# Patient Record
Sex: Female | Born: 1995 | Hispanic: Refuse to answer | Marital: Single | Smoking: Never smoker
Health system: Southern US, Community
[De-identification: ages and names within clinical notes are randomized; demographics above are authoritative.]

## PROBLEM LIST (undated history)

## (undated) ENCOUNTER — Inpatient Hospital Stay: Payer: Self-pay

## (undated) DIAGNOSIS — F329 Major depressive disorder, single episode, unspecified: Secondary | ICD-10-CM

## (undated) DIAGNOSIS — E559 Vitamin D deficiency, unspecified: Secondary | ICD-10-CM

## (undated) DIAGNOSIS — F909 Attention-deficit hyperactivity disorder, unspecified type: Secondary | ICD-10-CM

## (undated) DIAGNOSIS — F32A Depression, unspecified: Secondary | ICD-10-CM

## (undated) DIAGNOSIS — J45909 Unspecified asthma, uncomplicated: Secondary | ICD-10-CM

## (undated) DIAGNOSIS — E785 Hyperlipidemia, unspecified: Secondary | ICD-10-CM

## (undated) HISTORY — DX: Vitamin D deficiency, unspecified: E55.9

## (undated) HISTORY — DX: Hyperlipidemia, unspecified: E78.5

---
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
## 2016-10-24 LAB — OB RESULTS CONSOLE HEPATITIS B SURFACE ANTIGEN: Hepatitis B Surface Ag: NEGATIVE

## 2016-10-24 LAB — OB RESULTS CONSOLE VARICELLA ZOSTER ANTIBODY, IGG: Varicella: IMMUNE

## 2016-10-24 LAB — OB RESULTS CONSOLE RUBELLA ANTIBODY, IGM: RUBELLA: IMMUNE

## 2016-10-24 LAB — OB RESULTS CONSOLE HIV ANTIBODY (ROUTINE TESTING): HIV: NONREACTIVE

## 2016-10-24 LAB — OB RESULTS CONSOLE RPR: RPR: NONREACTIVE

## 2017-03-11 ENCOUNTER — Observation Stay
Admission: EM | Admit: 2017-03-11 | Discharge: 2017-03-11 | Disposition: A | Discharge status: Home / Self Care | Payer: Medicaid Other | Attending: Obstetrics and Gynecology | Admitting: Obstetrics and Gynecology

## 2017-03-11 DIAGNOSIS — Z79899 Other long term (current) drug therapy: Secondary | ICD-10-CM | POA: Diagnosis not present

## 2017-03-11 DIAGNOSIS — Z7982 Long term (current) use of aspirin: Secondary | ICD-10-CM | POA: Insufficient documentation

## 2017-03-11 DIAGNOSIS — N939 Abnormal uterine and vaginal bleeding, unspecified: Secondary | ICD-10-CM | POA: Diagnosis present

## 2017-03-11 DIAGNOSIS — O4693 Antepartum hemorrhage, unspecified, third trimester: Principal | ICD-10-CM | POA: Insufficient documentation

## 2017-03-11 DIAGNOSIS — Z3A29 29 weeks gestation of pregnancy: Secondary | ICD-10-CM | POA: Diagnosis not present

## 2017-03-11 HISTORY — DX: Depression, unspecified: F32.A

## 2017-03-11 HISTORY — DX: Attention-deficit hyperactivity disorder, unspecified type: F90.9

## 2017-03-11 HISTORY — DX: Major depressive disorder, single episode, unspecified: F32.9

## 2017-03-11 LAB — URINALYSIS, COMPLETE (UACMP) WITH MICROSCOPIC
BILIRUBIN URINE: NEGATIVE
Glucose, UA: NEGATIVE mg/dL
Ketones, ur: NEGATIVE mg/dL
NITRITE: NEGATIVE
PROTEIN: NEGATIVE mg/dL
SPECIFIC GRAVITY, URINE: 1.009 (ref 1.005–1.030)
pH: 6 (ref 5.0–8.0)

## 2017-03-11 LAB — COMPREHENSIVE METABOLIC PANEL
ALT: 12 U/L — ABNORMAL LOW (ref 14–54)
AST: 21 U/L (ref 15–41)
Albumin: 2.8 g/dL — ABNORMAL LOW (ref 3.5–5.0)
Alkaline Phosphatase: 102 U/L (ref 38–126)
Anion gap: 7 (ref 5–15)
BUN: 5 mg/dL — ABNORMAL LOW (ref 6–20)
CHLORIDE: 108 mmol/L (ref 101–111)
CO2: 21 mmol/L — ABNORMAL LOW (ref 22–32)
Calcium: 8.7 mg/dL — ABNORMAL LOW (ref 8.9–10.3)
Creatinine, Ser: 0.41 mg/dL — ABNORMAL LOW (ref 0.44–1.00)
GFR calc non Af Amer: >60 mL/min (ref >60)
Glucose, Bld: 84 mg/dL (ref 65–99)
POTASSIUM: 3.8 mmol/L (ref 3.5–5.1)
Sodium: 136 mmol/L (ref 135–145)
Total Bilirubin: 0.2 mg/dL — ABNORMAL LOW (ref 0.3–1.2)
Total Protein: 6.7 g/dL (ref 6.5–8.1)

## 2017-03-11 LAB — CBC
HEMATOCRIT: 35.6 % (ref 35.0–47.0)
Hemoglobin: 11.8 g/dL — ABNORMAL LOW (ref 12.0–16.0)
MCH: 26.7 pg (ref 26.0–34.0)
MCHC: 33.1 g/dL (ref 32.0–36.0)
MCV: 80.6 fL (ref 80.0–100.0)
PLATELETS: 307 10*3/uL (ref 150–440)
RBC: 4.41 MIL/uL (ref 3.80–5.20)
RDW: 13.9 % (ref 11.5–14.5)
WBC: 14.8 10*3/uL — AB (ref 3.6–11.0)

## 2017-03-11 LAB — PROTEIN / CREATININE RATIO, URINE
CREATININE, URINE: 46 mg/dL
PROTEIN CREATININE RATIO: 0.22 mg/mg{creat} — AB (ref 0.00–0.15)
TOTAL PROTEIN, URINE: 10 mg/dL

## 2017-03-11 NOTE — OB Triage Note (Signed)
Pt also stated she has a 2cm cyst on left ovary, is not tender on palpation.

## 2017-03-11 NOTE — Discharge Summary (Signed)
Progress Notes Date of Service: 03/11/2017 8:40 PM Boykin Nearing, MD  Obstetrics  Expand All Collapse All   [] Hide copied text [] Hover for attribution information Patient ID: Selena Lowe, female   DOB: 13-Aug-1996, 21 y.o.   MRN: [id number]  Selena Lowe is a 21 y.o. female. She is at [redacted]w[redacted]d gestation. No LMP recorded. Patient is pregnant. Estimated Date of Delivery: 05/26/17 based on a 18 week u/s . Unsure LMP   Prenatal care site: [facility]  Chief Complaint: Pt states at 4pm today she note  Vaginal bleeding that came on unprovoked . No recent intercourse and no adb trauma. No CTX , no cramping .  Good fetal movt      Maternal Medical History:       Past Medical History:  Diagnosis Date  . Attention deficit hyperactivity disorder (ADHD)   . Depression     History reviewed. No pertinent surgical history.      Allergies  Allergen Reactions  . Penicillins Hives    Prior to Admission medications   Medication Sig Start Date End Date Taking? Authorizing Provider  aspirin 81 MG tablet Take 81 mg by mouth daily.   Yes Historical Provider, MD  prenatal vitamin w/FE, FA (PRENATAL 1 + 1) 27-1 MG TABS tablet Take 1 tablet by mouth daily at 12 noon.   Yes Historical Provider, MD     Social History: She  reports that she has never smoked. She has never used smokeless tobacco. She reports that she does not drink alcohol or use drugs.  Family History: family history includes Cancer in her maternal grandmother.  no history of gyn cancers  Review of Systems: A full review of systems was performed and negative except as noted in the HPI.   General Obesity  Eyes : no vision change  Gyn :+ vaginal bleeding  Cv : no HTN   O:  BP (!) 132/91   Pulse (!) 119   Temp 98.1 F (36.7 C) (Oral)  No results found for this or any previous visit (from the past 48 hour(s)).    U/S bedside by me : right posterior lateral placenta , no evidence of  marginal  Placenta or previa.VTX  Constitutional: NAD, AAOx3  HE/ENT: extraocular movements grossly intact, moist mucous membranes CV: RRR PULM: nl respiratory effort, CTABL                                         Abd: gravid, non-tender, non-distended, soft                                                  Ext: Non-tender, Nonedmeatous                     Psych: mood appropriate, speech normal Pelvic:cx: closed / 50%  / OOP No blood on glove   FHT/NST:+ 10x10accelsx2, baseline 130   TOCO: quiet  Lab Results Last 24 Hours       Results for orders placed or performed during the hospital encounter of 03/11/17 (from the past 24 hour(s))  CBC     Status: Abnormal   Collection Time: 03/11/17  8:49 PM  Result Value Ref Range   WBC 14.8 (H)  3.6 - 11.0 K/uL   RBC 4.41 3.80 - 5.20 MIL/uL   Hemoglobin 11.8 (L) 12.0 - 16.0 g/dL   HCT 35.6 35.0 - 47.0 %   MCV 80.6 80.0 - 100.0 fL   MCH 26.7 26.0 - 34.0 pg   MCHC 33.1 32.0 - 36.0 g/dL   RDW 13.9 11.5 - 14.5 %   Platelets 307 150 - 440 K/uL  Comprehensive metabolic panel     Status: Abnormal   Collection Time: 03/11/17  8:49 PM  Result Value Ref Range   Sodium 136 135 - 145 mmol/L   Potassium 3.8 3.5 - 5.1 mmol/L   Chloride 108 101 - 111 mmol/L   CO2 21 (L) 22 - 32 mmol/L   Glucose, Bld 84 65 - 99 mg/dL   BUN 5 (L) 6 - 20 mg/dL   Creatinine, Ser 0.41 (L) 0.44 - 1.00 mg/dL   Calcium 8.7 (L) 8.9 - 10.3 mg/dL   Total Protein 6.7 6.5 - 8.1 g/dL   Albumin 2.8 (L) 3.5 - 5.0 g/dL   AST 21 15 - 41 U/L   ALT 12 (L) 14 - 54 U/L   Alkaline Phosphatase 102 38 - 126 U/L   Total Bilirubin 0.2 (L) 0.3 - 1.2 mg/dL   GFR calc non Af Amer >60 >60 mL/min   GFR calc Af Amer >60 >60 mL/min   Anion gap 7 5 - 15  Protein / creatinine ratio, urine     Status: Abnormal   Collection Time: 03/11/17  9:02 PM  Result Value Ref Range   Creatinine, Urine 46 mg/dL   Total Protein, Urine 10 mg/dL   Protein Creatinine Ratio  0.22 (H) 0.00 - 0.15 mg/mg[Cre]  Urinalysis, Complete w Microscopic     Status: Abnormal   Collection Time: 03/11/17  9:02 PM  Result Value Ref Range   Color, Urine YELLOW (A) YELLOW   APPearance CLOUDY (A) CLEAR   Specific Gravity, Urine 1.009 1.005 - 1.030   pH 6.0 5.0 - 8.0   Glucose, UA NEGATIVE NEGATIVE mg/dL   Hgb urine dipstick MODERATE (A) NEGATIVE   Bilirubin Urine NEGATIVE NEGATIVE   Ketones, ur NEGATIVE NEGATIVE mg/dL   Protein, ur NEGATIVE NEGATIVE mg/dL   Nitrite NEGATIVE NEGATIVE   Leukocytes, UA LARGE (A) NEGATIVE   RBC / HPF 0-5 0 - 5 RBC/hpf   WBC, UA 6-30 0 - 5 WBC/hpf   Bacteria, UA MANY (A) NONE SEEN   Squamous Epithelial / LPF 0-5 (A) NONE SEEN   Mucous PRESENT      A/P:vaginal bleeding without etiology . Not ongoing . Bedside u/s nl . Reassuring fetal monitoring  Urine culture pending  D/c to home with bleeding precautions      ----- Gwen Her Zailyn Thoennes  MD Attending Obstetrician and Gynecologist [facility], Department of [facility]     Electronically signed by Boykin Nearing, MD at 03/11/2017 10:55 PM      ED to Hosp-Admission (Current) on 03/11/2017        Detailed Report

## 2017-03-11 NOTE — Progress Notes (Signed)
Patient ID: Selena Lowe, female   DOB: 1996-09-23, 21 y.o.   MRN: [id number]  Selena Lowe is a 21 y.o. female. She is at [redacted]w[redacted]d gestation. No LMP recorded. Patient is pregnant. Estimated Date of Delivery: 05/26/17 based on a 18 week u/s . Unsure LMP   Prenatal care site: [facility]  Chief Complaint: Pt states at 4pm today she note  Vaginal bleeding that came on unprovoked . No recent intercourse and no adb trauma. No CTX , no cramping .  Good fetal movt      Maternal Medical History:   Past Medical History:  Diagnosis Date  . Attention deficit hyperactivity disorder (ADHD)   . Depression     History reviewed. No pertinent surgical history.  Allergies  Allergen Reactions  . Penicillins Hives    Prior to Admission medications   Medication Sig Start Date End Date Taking? Authorizing Provider  aspirin 81 MG tablet Take 81 mg by mouth daily.   Yes Historical Provider, MD  prenatal vitamin w/FE, FA (PRENATAL 1 + 1) 27-1 MG TABS tablet Take 1 tablet by mouth daily at 12 noon.   Yes Historical Provider, MD     Social History: She  reports that she has never smoked. She has never used smokeless tobacco. She reports that she does not drink alcohol or use drugs.  Family History: family history includes Cancer in her maternal grandmother.  no history of gyn cancers  Review of Systems: A full review of systems was performed and negative except as noted in the HPI.   General Obesity  Eyes : no vision change  Gyn :+ vaginal bleeding  Cv : no HTN   O:  BP (!) 132/91   Pulse (!) 119   Temp 98.1 F (36.7 C) (Oral)  No results found for this or any previous visit (from the past 48 hour(s)).    U/S bedside by me : right posterior lateral placenta , no evidence of marginal  Placenta or previa.VTX  Constitutional: NAD, AAOx3  HE/ENT: extraocular movements grossly intact, moist mucous membranes CV: RRR PULM: nl respiratory effort, CTABL     Abd: gravid, non-tender,  non-distended, soft      Ext: Non-tender, Nonedmeatous   Psych: mood appropriate, speech normal Pelvic:cx: closed / 50%  / OOP No blood on glove   FHT/NST:+ 10x10accelsx2, baseline 130   TOCO: quiet  Results for orders placed or performed during the hospital encounter of 03/11/17 (from the past 24 hour(s))  CBC     Status: Abnormal   Collection Time: 03/11/17  8:49 PM  Result Value Ref Range   WBC 14.8 (H) 3.6 - 11.0 K/uL   RBC 4.41 3.80 - 5.20 MIL/uL   Hemoglobin 11.8 (L) 12.0 - 16.0 g/dL   HCT 35.6 35.0 - 47.0 %   MCV 80.6 80.0 - 100.0 fL   MCH 26.7 26.0 - 34.0 pg   MCHC 33.1 32.0 - 36.0 g/dL   RDW 13.9 11.5 - 14.5 %   Platelets 307 150 - 440 K/uL  Comprehensive metabolic panel     Status: Abnormal   Collection Time: 03/11/17  8:49 PM  Result Value Ref Range   Sodium 136 135 - 145 mmol/L   Potassium 3.8 3.5 - 5.1 mmol/L   Chloride 108 101 - 111 mmol/L   CO2 21 (L) 22 - 32 mmol/L   Glucose, Bld 84 65 - 99 mg/dL   BUN 5 (L) 6 - 20 mg/dL  Creatinine, Ser 0.41 (L) 0.44 - 1.00 mg/dL   Calcium 8.7 (L) 8.9 - 10.3 mg/dL   Total Protein 6.7 6.5 - 8.1 g/dL   Albumin 2.8 (L) 3.5 - 5.0 g/dL   AST 21 15 - 41 U/L   ALT 12 (L) 14 - 54 U/L   Alkaline Phosphatase 102 38 - 126 U/L   Total Bilirubin 0.2 (L) 0.3 - 1.2 mg/dL   GFR calc non Af Amer >60 >60 mL/min   GFR calc Af Amer >60 >60 mL/min   Anion gap 7 5 - 15  Protein / creatinine ratio, urine     Status: Abnormal   Collection Time: 03/11/17  9:02 PM  Result Value Ref Range   Creatinine, Urine 46 mg/dL   Total Protein, Urine 10 mg/dL   Protein Creatinine Ratio 0.22 (H) 0.00 - 0.15 mg/mg[Cre]  Urinalysis, Complete w Microscopic     Status: Abnormal   Collection Time: 03/11/17  9:02 PM  Result Value Ref Range   Color, Urine YELLOW (A) YELLOW   APPearance CLOUDY (A) CLEAR   Specific Gravity, Urine 1.009 1.005 - 1.030   pH 6.0 5.0 - 8.0   Glucose, UA NEGATIVE NEGATIVE mg/dL   Hgb urine dipstick MODERATE (A) NEGATIVE    Bilirubin Urine NEGATIVE NEGATIVE   Ketones, ur NEGATIVE NEGATIVE mg/dL   Protein, ur NEGATIVE NEGATIVE mg/dL   Nitrite NEGATIVE NEGATIVE   Leukocytes, UA LARGE (A) NEGATIVE   RBC / HPF 0-5 0 - 5 RBC/hpf   WBC, UA 6-30 0 - 5 WBC/hpf   Bacteria, UA MANY (A) NONE SEEN   Squamous Epithelial / LPF 0-5 (A) NONE SEEN   Mucous PRESENT    A/P:vaginal bleeding without etiology . Not ongoing . Bedside u/s nl . Reassuring fetal monitoring  Urine culture pending  D/c to home with bleeding precautions      ----- Gwen Her Sincere Liuzzi  MD Attending Obstetrician and Gynecologist [facility], Department of [facility]

## 2017-03-11 NOTE — OB Triage Note (Signed)
Pt arrived to LDR 2 from ED with vaginal bleeding happening around 1031-2811. It was a one time occurrence. Patient stated that it was enough to cover her hands. Saying it was a "hand full of quarter size clots". No frank bleeding from vagina, and has not had anymore bleeding since then. Pt denies recent sex, LOF, abdominal pain or cervical exam. Pt came to hospital right away. Pt placed on monitor and oriented to room. Will cont. To monitor.

## 2017-03-12 ENCOUNTER — Encounter (HOSPITAL_COMMUNITY): Payer: Self-pay

## 2017-03-14 LAB — URINE CULTURE
Culture: >=100000 — AB
SPECIAL REQUESTS: NORMAL

## 2017-05-29 ENCOUNTER — Other Ambulatory Visit: Payer: Self-pay | Admitting: Obstetrics and Gynecology

## 2017-05-29 DIAGNOSIS — Z3A40 40 weeks gestation of pregnancy: Secondary | ICD-10-CM

## 2017-06-01 ENCOUNTER — Inpatient Hospital Stay
Admission: EM | Admit: 2017-06-01 | Discharge: 2017-06-04 | DRG: 775 | Disposition: A | Discharge status: Home / Self Care | Payer: Medicaid Other | Attending: Obstetrics and Gynecology | Admitting: Obstetrics and Gynecology

## 2017-06-01 DIAGNOSIS — O9902 Anemia complicating childbirth: Secondary | ICD-10-CM | POA: Diagnosis present

## 2017-06-01 DIAGNOSIS — Z3A41 41 weeks gestation of pregnancy: Secondary | ICD-10-CM | POA: Diagnosis not present

## 2017-06-01 DIAGNOSIS — Z7982 Long term (current) use of aspirin: Secondary | ICD-10-CM | POA: Diagnosis not present

## 2017-06-01 DIAGNOSIS — Z68.41 Body mass index (BMI) pediatric, 85th percentile to less than 95th percentile for age: Secondary | ICD-10-CM

## 2017-06-01 DIAGNOSIS — O99214 Obesity complicating childbirth: Secondary | ICD-10-CM | POA: Diagnosis present

## 2017-06-01 DIAGNOSIS — D649 Anemia, unspecified: Secondary | ICD-10-CM | POA: Diagnosis present

## 2017-06-01 DIAGNOSIS — O480 Post-term pregnancy: Secondary | ICD-10-CM | POA: Diagnosis present

## 2017-06-01 DIAGNOSIS — Z880 Allergy status to penicillin: Secondary | ICD-10-CM

## 2017-06-01 HISTORY — DX: Unspecified asthma, uncomplicated: J45.909

## 2017-06-01 LAB — COMPREHENSIVE METABOLIC PANEL
ALK PHOS: 192 U/L — AB (ref 38–126)
ALT: 12 U/L — ABNORMAL LOW (ref 14–54)
ANION GAP: 9 (ref 5–15)
AST: 28 U/L (ref 15–41)
Albumin: 2.3 g/dL — ABNORMAL LOW (ref 3.5–5.0)
BILIRUBIN TOTAL: 0.2 mg/dL — AB (ref 0.3–1.2)
BUN: 8 mg/dL (ref 6–20)
CALCIUM: 10.4 mg/dL — AB (ref 8.9–10.3)
CO2: 23 mmol/L (ref 22–32)
Chloride: 105 mmol/L (ref 101–111)
Creatinine, Ser: 0.52 mg/dL (ref 0.44–1.00)
GFR calc non Af Amer: >60 mL/min (ref >60)
Glucose, Bld: 97 mg/dL (ref 65–99)
POTASSIUM: 4.3 mmol/L (ref 3.5–5.1)
Sodium: 137 mmol/L (ref 135–145)
TOTAL PROTEIN: 6.4 g/dL — AB (ref 6.5–8.1)

## 2017-06-01 LAB — CBC
HCT: 36.2 % (ref 35.0–47.0)
Hemoglobin: 11.9 g/dL — ABNORMAL LOW (ref 12.0–16.0)
MCH: 25.8 pg — AB (ref 26.0–34.0)
MCHC: 33 g/dL (ref 32.0–36.0)
MCV: 78.2 fL — ABNORMAL LOW (ref 80.0–100.0)
PLATELETS: 305 10*3/uL (ref 150–440)
RBC: 4.63 MIL/uL (ref 3.80–5.20)
RDW: 15.4 % — ABNORMAL HIGH (ref 11.5–14.5)
WBC: 15.2 10*3/uL — ABNORMAL HIGH (ref 3.6–11.0)

## 2017-06-01 LAB — PROTEIN / CREATININE RATIO, URINE
CREATININE, URINE: 23 mg/dL
PROTEIN CREATININE RATIO: 0.3 mg/mg{creat} — AB (ref 0.00–0.15)
TOTAL PROTEIN, URINE: 7 mg/dL

## 2017-06-01 LAB — URIC ACID: URIC ACID, SERUM: 5.8 mg/dL (ref 2.3–6.6)

## 2017-06-01 MED ORDER — SOD CITRATE-CITRIC ACID 500-334 MG/5ML PO SOLN
30.0000 mL | ORAL | Status: DC | PRN
  Administered 2017-06-01: 30 mL via ORAL
  Filled 2017-06-01 (×2): qty 30

## 2017-06-01 MED ORDER — ONDANSETRON HCL 4 MG/2ML IJ SOLN
4.0000 mg | Freq: Four times a day (QID) | INTRAMUSCULAR | Status: DC | PRN
  Administered 2017-06-02: 4 mg via INTRAVENOUS
  Filled 2017-06-01: qty 2

## 2017-06-01 MED ORDER — AMMONIA AROMATIC IN INHA
RESPIRATORY_TRACT | Status: AC
  Filled 2017-06-01: qty 10

## 2017-06-01 MED ORDER — LIDOCAINE HCL (PF) 1 % IJ SOLN
INTRAMUSCULAR | Status: AC
  Filled 2017-06-01: qty 30

## 2017-06-01 MED ORDER — LACTATED RINGERS IV SOLN: 500.0000 mL | INTRAVENOUS | Status: DC | PRN

## 2017-06-01 MED ORDER — LACTATED RINGERS IV SOLN
INTRAVENOUS | Status: DC
  Administered 2017-06-01 – 2017-06-02 (×2): via INTRAVENOUS
  Administered 2017-06-02: 1000 mL via INTRAVENOUS

## 2017-06-01 MED ORDER — HYDRALAZINE HCL 20 MG/ML IJ SOLN
10.0000 mg | Freq: Once | INTRAMUSCULAR | Status: DC | PRN
  Filled 2017-06-01: qty 0.5

## 2017-06-01 MED ORDER — LABETALOL HCL 5 MG/ML IV SOLN
20.0000 mg | INTRAVENOUS | Status: DC | PRN
  Filled 2017-06-01: qty 16

## 2017-06-01 MED ORDER — OXYTOCIN 10 UNIT/ML IJ SOLN
INTRAMUSCULAR | Status: AC
  Filled 2017-06-01: qty 2

## 2017-06-01 MED ORDER — MISOPROSTOL 200 MCG PO TABS
ORAL_TABLET | ORAL | Status: AC
  Filled 2017-06-01: qty 4

## 2017-06-01 MED ORDER — OXYTOCIN 40 UNITS IN LACTATED RINGERS INFUSION - SIMPLE MED
1.0000 m[IU]/min | INTRAVENOUS | Status: DC
  Administered 2017-06-01: 1 m[IU]/min via INTRAVENOUS
  Filled 2017-06-01: qty 1000

## 2017-06-01 MED ORDER — OXYTOCIN 40 UNITS IN LACTATED RINGERS INFUSION - SIMPLE MED: 2.5000 [IU]/h | INTRAVENOUS | Status: DC

## 2017-06-01 MED ORDER — OXYTOCIN BOLUS FROM INFUSION: 500.0000 mL | Freq: Once | INTRAVENOUS | Status: DC

## 2017-06-01 MED ORDER — LIDOCAINE HCL (PF) 1 % IJ SOLN: 30.0000 mL | INTRAMUSCULAR | Status: DC | PRN

## 2017-06-01 MED ORDER — TERBUTALINE SULFATE 1 MG/ML IJ SOLN: 0.2500 mg | Freq: Once | INTRAMUSCULAR | Status: DC | PRN

## 2017-06-01 MED ORDER — SODIUM CHLORIDE FLUSH 0.9 % IV SOLN
INTRAVENOUS | Status: AC
  Filled 2017-06-01: qty 10

## 2017-06-01 MED ORDER — ACETAMINOPHEN 325 MG PO TABS: 650.0000 mg | ORAL_TABLET | ORAL | Status: DC | PRN

## 2017-06-01 MED ORDER — BUTORPHANOL TARTRATE 1 MG/ML IJ SOLN
1.0000 mg | INTRAMUSCULAR | Status: DC | PRN
  Administered 2017-06-02 (×2): 1 mg via INTRAVENOUS
  Filled 2017-06-01: qty 2

## 2017-06-01 MED ORDER — MISOPROSTOL 25 MCG QUARTER TABLET: 25.0000 ug | ORAL_TABLET | ORAL | Status: DC | PRN

## 2017-06-02 ENCOUNTER — Encounter: Payer: Self-pay | Admitting: Anesthesiology

## 2017-06-02 ENCOUNTER — Inpatient Hospital Stay: Payer: Medicaid Other | Admitting: Anesthesiology

## 2017-06-02 LAB — TYPE AND SCREEN
ABO/RH(D): B POS
ANTIBODY SCREEN: NEGATIVE

## 2017-06-02 MED ORDER — SODIUM CHLORIDE 0.9 % IV SOLN
INTRAVENOUS | Status: DC | PRN
  Administered 2017-06-02 (×2): 5 mL via EPIDURAL

## 2017-06-02 MED ORDER — NALOXONE HCL 2 MG/2ML IJ SOSY
1.0000 ug/kg/h | PREFILLED_SYRINGE | INTRAMUSCULAR | Status: DC | PRN
  Filled 2017-06-02: qty 2

## 2017-06-02 MED ORDER — SODIUM CHLORIDE FLUSH 0.9 % IV SOLN
INTRAVENOUS | Status: AC
  Filled 2017-06-02: qty 10

## 2017-06-02 MED ORDER — WITCH HAZEL-GLYCERIN EX PADS: 1.0000 "application " | MEDICATED_PAD | CUTANEOUS | Status: DC | PRN

## 2017-06-02 MED ORDER — PRENATAL MULTIVITAMIN CH
1.0000 | ORAL_TABLET | Freq: Every day | ORAL | Status: DC
  Administered 2017-06-03 – 2017-06-04 (×2): 1 via ORAL
  Filled 2017-06-02 (×2): qty 1

## 2017-06-02 MED ORDER — IBUPROFEN 600 MG PO TABS
600.0000 mg | ORAL_TABLET | Freq: Four times a day (QID) | ORAL | Status: DC
  Administered 2017-06-02 – 2017-06-04 (×8): 600 mg via ORAL
  Filled 2017-06-02 (×8): qty 1

## 2017-06-02 MED ORDER — DIBUCAINE 1 % RE OINT: 1.0000 "application " | TOPICAL_OINTMENT | RECTAL | Status: DC | PRN

## 2017-06-02 MED ORDER — LIDOCAINE-EPINEPHRINE (PF) 1.5 %-1:200000 IJ SOLN
INTRAMUSCULAR | Status: DC | PRN
  Administered 2017-06-02: 3 mL via EPIDURAL

## 2017-06-02 MED ORDER — NALBUPHINE HCL 10 MG/ML IJ SOLN: 5.0000 mg | Freq: Once | INTRAMUSCULAR | Status: DC | PRN

## 2017-06-02 MED ORDER — SIMETHICONE 80 MG PO CHEW: 80.0000 mg | CHEWABLE_TABLET | ORAL | Status: DC | PRN

## 2017-06-02 MED ORDER — FENTANYL 2.5 MCG/ML W/ROPIVACAINE 0.2% IN NS 100 ML EPIDURAL INFUSION (ARMC-ANES)
EPIDURAL | Status: DC | PRN
  Administered 2017-06-02: 10 mL/h via EPIDURAL

## 2017-06-02 MED ORDER — SODIUM CHLORIDE 0.9% FLUSH: 3.0000 mL | INTRAVENOUS | Status: DC | PRN

## 2017-06-02 MED ORDER — FENTANYL 2.5 MCG/ML W/ROPIVACAINE 0.2% IN NS 100 ML EPIDURAL INFUSION (ARMC-ANES)
EPIDURAL | Status: AC
  Filled 2017-06-02: qty 100

## 2017-06-02 MED ORDER — MEASLES, MUMPS & RUBELLA VAC ~~LOC~~ INJ
0.5000 mL | INJECTION | Freq: Once | SUBCUTANEOUS | Status: DC
  Filled 2017-06-02: qty 0.5

## 2017-06-02 MED ORDER — ACETAMINOPHEN 325 MG PO TABS: 650.0000 mg | ORAL_TABLET | ORAL | Status: DC | PRN

## 2017-06-02 MED ORDER — ZOLPIDEM TARTRATE 5 MG PO TABS: 5.0000 mg | ORAL_TABLET | Freq: Every evening | ORAL | Status: DC | PRN

## 2017-06-02 MED ORDER — FENTANYL 2.5 MCG/ML W/ROPIVACAINE 0.2% IN NS 100 ML EPIDURAL INFUSION (ARMC-ANES): 10.0000 mL/h | EPIDURAL | Status: DC

## 2017-06-02 MED ORDER — ONDANSETRON HCL 4 MG/2ML IJ SOLN: 4.0000 mg | INTRAMUSCULAR | Status: DC | PRN

## 2017-06-02 MED ORDER — KETOROLAC TROMETHAMINE 30 MG/ML IJ SOLN: 30.0000 mg | Freq: Four times a day (QID) | INTRAMUSCULAR | Status: AC | PRN

## 2017-06-02 MED ORDER — BENZOCAINE-MENTHOL 20-0.5 % EX AERO
1.0000 "application " | INHALATION_SPRAY | CUTANEOUS | Status: DC | PRN
  Administered 2017-06-03: 1 via TOPICAL
  Filled 2017-06-02: qty 56

## 2017-06-02 MED ORDER — MEPERIDINE HCL 25 MG/ML IJ SOLN: 6.2500 mg | INTRAMUSCULAR | Status: DC | PRN

## 2017-06-02 MED ORDER — TETANUS-DIPHTH-ACELL PERTUSSIS 5-2.5-18.5 LF-MCG/0.5 IM SUSP: 0.5000 mL | Freq: Once | INTRAMUSCULAR | Status: DC

## 2017-06-02 MED ORDER — FLEET ENEMA 7-19 GM/118ML RE ENEM: 1.0000 | ENEMA | Freq: Every day | RECTAL | Status: DC | PRN

## 2017-06-02 MED ORDER — DIPHENHYDRAMINE HCL 25 MG PO CAPS: 25.0000 mg | ORAL_CAPSULE | Freq: Four times a day (QID) | ORAL | Status: DC | PRN

## 2017-06-02 MED ORDER — DIPHENHYDRAMINE HCL 25 MG PO CAPS: 25.0000 mg | ORAL_CAPSULE | ORAL | Status: DC | PRN

## 2017-06-02 MED ORDER — COCONUT OIL OIL
1.0000 | TOPICAL_OIL | Status: DC | PRN
Start: 2017-06-02 — End: 2017-06-04

## 2017-06-02 MED ORDER — NALOXONE HCL 0.4 MG/ML IJ SOLN: 0.4000 mg | INTRAMUSCULAR | Status: DC | PRN

## 2017-06-02 MED ORDER — SODIUM CHLORIDE 0.9 % IV SOLN: 250.0000 mL | INTRAVENOUS | Status: DC | PRN

## 2017-06-02 MED ORDER — SENNOSIDES-DOCUSATE SODIUM 8.6-50 MG PO TABS
2.0000 | ORAL_TABLET | ORAL | Status: DC
  Administered 2017-06-03 (×2): 2 via ORAL
  Filled 2017-06-02 (×2): qty 2

## 2017-06-02 MED ORDER — NALBUPHINE HCL 10 MG/ML IJ SOLN: 5.0000 mg | INTRAMUSCULAR | Status: DC | PRN

## 2017-06-02 MED ORDER — DIPHENHYDRAMINE HCL 50 MG/ML IJ SOLN
12.5000 mg | INTRAMUSCULAR | Status: DC | PRN
  Administered 2017-06-02: 12.5 mg via INTRAVENOUS
  Filled 2017-06-02: qty 1

## 2017-06-02 MED ORDER — ONDANSETRON HCL 4 MG/2ML IJ SOLN: 4.0000 mg | Freq: Three times a day (TID) | INTRAMUSCULAR | Status: DC | PRN

## 2017-06-02 MED ORDER — SODIUM CHLORIDE 0.9% FLUSH
3.0000 mL | Freq: Two times a day (BID) | INTRAVENOUS | Status: DC
  Administered 2017-06-03 (×2): 3 mL via INTRAVENOUS

## 2017-06-02 MED ORDER — ONDANSETRON HCL 4 MG PO TABS: 4.0000 mg | ORAL_TABLET | ORAL | Status: DC | PRN

## 2017-06-02 MED ORDER — LIDOCAINE HCL (PF) 1 % IJ SOLN
INTRAMUSCULAR | Status: DC | PRN
  Administered 2017-06-02: 3 mL via SUBCUTANEOUS

## 2017-06-02 MED ORDER — BISACODYL 10 MG RE SUPP: 10.0000 mg | Freq: Every day | RECTAL | Status: DC | PRN

## 2017-06-02 NOTE — Progress Notes (Signed)
Pushing well with UC's. Baby is down to a +2 station and pt is becoming tired. Working pull and tug method of pushing. Vtx is barely visible at uptimum pushing phase. Will continue pushing. FHT's 120-130.

## 2017-06-02 NOTE — Progress Notes (Signed)
S: Piushing with UC's, baby is now at +2 station and making progress,. Pt is exhausted but, doing well. On  epidural. + CTX, no LOF, VB O: Vitals:   06/02/17 0755 06/02/17 0915 06/02/17 1259 06/02/17 1300  BP:      Pulse:      Resp: 18 18 18 20   Temp:  97.9 F (36.6 C) 98.2 F (36.8 C)   TempSrc:  Oral Oral   SpO2:      Weight:      Height:         Gen: NAD, AAOx3      Abd: FNTTP      Ext: Non-tender, Nonedmeatous    mod var + accelerations no decelerations, Cat 1 strip  TOCO: Q 2  min SVE: C/C/+2 station with pushing   A/P:  21 y.o. yo G1P0 at [redacted]w[redacted]d for  postdates  Labor: progressing with pushing   FWB: Reassuring Cat 1 tracing.   GBS: neg  Antic NSVD with current sitaution   Catheryn Bacon 3:21 PM

## 2017-06-02 NOTE — Progress Notes (Signed)
VAGINAL DELIVERY NOTE:  Date of Delivery: 06/02/2017 Primary OB: [facility]  Gestational Age/EDD: [redacted]w[redacted]d 05/26/2017, by Ultrasound Antepartum complications: Obesity, Elevated BP without diagnosis of pre-ecclampsia,  Attending Physician: Glenis Smoker CNM Delivery Type:NSVD of viable female with 2nds degree lac Anesthesia: 1% local (xylocaine) Laceration: 2nd degree  Episiotomy: None Placenta: SDOP intact  Intrapartum complications: Maternal exhaustion with pushing Estimated Blood Loss: 200 ml's GBS: neg Procedure Details:  Pt pushing and in the lithotomy position and vtx del in straight OA pos, restituted to LOA with no CAN noted. Ant and post shoulder del and body followed with del at 1618 and to mom'a abd. Delayed cord clamping with cord cut by family after CCx2 and cut. Cord blood collection was obtained and sent to [facility] per protocol. SDOP intact at 1635. FF and lochia mod. No clots. 2nd degree lac noted in the perineal area and repaired after local infiltration of 1% xylocaine. 3-0 Ch on CT used for repair. Needle and sponge count correct. Bonding with infant with skin to skin. Needle and sponge count correct. Pt stable and hemostasis achieved.   Baby: Liveborn female, Apgars (7,8), weight 7 #, 4oz, baby named "Lelan Pons"

## 2017-06-02 NOTE — H&P (Signed)
Obstetrics Admission History & Physical  Referring Provider: [facility] Primary OBGYN: [facility]   Chief Complaint: admitted for IOL for postdates with LMP of 08/05/17 and EDD of 05/12/17. [place] at 10 1/7 weeks with EDD of 05/26/17.  History of Present Illness  21 y.o. G1P0 @ [redacted]w[redacted]d (Dating: . Pregnancy complicated by: Obesity,   Selena Lowe presents for IOL for postdates.  Review of Systems: Positive for pelvic pressure. .  Otherwise, her 12 point review of systems is negative or as noted in the History of Present Illness.  Patient Active Problem List   Diagnosis Date Noted  . Post term pregnancy 06/01/2017  . Vaginal spotting 03/11/2017     PMHx:  Past Medical History:  Diagnosis Date  . Asthma   . Attention deficit hyperactivity disorder (ADHD)   . Depression    PSHx: History reviewed. No pertinent surgical history. Medications:  Prescriptions Prior to Admission  Medication Sig Dispense Refill Last Dose  . aspirin 81 MG tablet Take 81 mg by mouth daily.   05/31/2017 at Unknown time  . prenatal vitamin w/FE, FA (PRENATAL 1 + 1) 27-1 MG TABS tablet Take 1 tablet by mouth daily at 12 noon.   05/31/2017 at Unknown time   Allergies: is allergic to penicillins. OBHx:  OB History  Gravida Para Term Preterm AB Living  1            SAB TAB Ectopic Multiple Live Births               # Outcome Date GA Lbr Len/2nd Weight Sex Delivery Anes PTL Lv  1 Current               GYNHx:  History of abnormal pap smears:  History of STIs:        FHx:  Family History  Problem Relation Age of Onset  . Cancer Maternal Grandmother    Soc Hx:  Social History   Social History  . Marital status: Single    Spouse name: N/A  . Number of children: N/A  . Years of education: N/A   Occupational History  . Not on file.   Social History Main Topics  . Smoking status: Never Smoker  . Smokeless tobacco: Never Used  . Alcohol use No  . Drug use: No  . Sexual activity: Yes   Other Topics  Concern  . Not on file   Social History Narrative  . No narrative on file   FOB present and involved.   Objective   Vitals:   06/02/17 1634 06/02/17 1704  BP:  (!) 117/93  Pulse:  100  Resp: 16   Temp: 98.7 F (37.1 C)    Temp:  [97.9 F (36.6 C)-98.8 F (37.1 C)] 98.7 F (37.1 C) (06/11 1634) Pulse Rate:  [71-118] 100 (06/11 1704) Resp:  [16-20] 16 (06/11 1634) BP: (93-155)/(52-106) 117/93 (06/11 1704) SpO2:  [97 %-98 %] 97 % (06/11 0520) Weight:  [247 lb (112 kg)] 247 lb (112 kg) (06/10 2035) Temp (24hrs), Avg:98.3 F (36.8 C), Min:97.9 F (36.6 C), Max:98.8 F (37.1 C)   Intake/Output Summary (Last 24 hours) at 06/02/17 1801 Last data filed at 06/02/17 1618  Gross per 24 hour  Intake                0 ml  Output              200 ml  Net             -  200 ml     Current Vital Signs 24h Vital Sign Ranges  T 98.7 F (37.1 C) Temp  Avg: 98.3 F (36.8 C)  Min: 97.9 F (36.6 C)  Max: 98.8 F (37.1 C)  BP (!) 117/93 BP  Min: 93/63  Max: 155/106  HR 100 Pulse  Avg: 93.4  Min: 71  Max: 118  RR 16 Resp  Avg: 18  Min: 16  Max: 20  SaO2 97 %   SpO2  Avg: 97.5 %  Min: 97 %  Max: 98 %       24 Hour I/O Current Shift I/O  Time Ins Outs No intake/output data recorded. 06/11 0701 - 06/11 1900 In: -  Out: 200    EFM: H&P done after delivery  Toco: see notes  General: Well nourished, well developed female in no acute distress.  Heart: S1S2, RRR, No M/R/G. Lungs: CTA bilat, no W/R/R. Skin:  Warm and dry.  Cardiovascular: S1S2, RRR, No M/R/G. Respiratory:  Clear to auscultation bilateral. Normal respiratory effort. No W/R/R. Abdomen: Gravid  Neuro/Psych:  Normal mood and affect.    SVE: See admit progress notes from this am Leopolds/EFW: 7#6 oz  Labs  Varicella non-immune, RI, B pos, Antibody neg, HIVneg, HepB neg, Gc/Ch neg, GBS neg,  Ultrasounds: 10 1/7 weeks with EDD moved to 05/26/17 Normal Anatomy scan.  Tdap UTD  Perinatal info    Assessment & Plan    21 y.o. G1P0 @ [redacted]w[redacted]d with signs and symptoms, with postdates and plans for IOL.  IUP at 41 weeks Obesity Risks of induction discussed with pt including fetal or uterine intolerance, bleeding, infection, poss of uterine rupture and pt accepts and agrees with IOL.  Catheryn Bacon, MSN, CNM, [facility]

## 2017-06-02 NOTE — Discharge Instructions (Signed)
Care After Vaginal Delivery °Congratulations on your new baby!! ° °Refer to this sheet in the next few weeks. These discharge instructions provide you with information on caring for yourself after delivery. Your caregiver may also give you specific instructions. Your treatment has been planned according to the most current medical practices available, but problems sometimes occur. Call your caregiver if you have any problems or questions after you go home. ° °HOME CARE INSTRUCTIONS °· Take over-the-counter or prescription medicines only as directed by your caregiver or pharmacist. °· Do not drink alcohol, especially if you are breastfeeding or taking medicine to relieve pain. °· Do not chew or smoke tobacco. °· Do not use illegal drugs. °· Continue to use good perineal care. Good perineal care includes: °¨ Wiping your perineum from front to back. °¨ Keeping your perineum clean. °· Do not use tampons or douche until your caregiver says it is okay. °· Shower, wash your hair, and take tub baths as directed by your caregiver. °· Wear a well-fitting bra that provides breast support. °· Eat healthy foods. °· Drink enough fluids to keep your urine clear or pale yellow. °· Eat high-fiber foods such as whole grain cereals and breads, brown rice, beans, and fresh fruits and vegetables every day. These foods may help prevent or relieve constipation. °· Follow your caregiver's recommendations regarding resumption of activities such as climbing stairs, driving, lifting, exercising, or traveling. Specifically, no driving for two weeks, so that you are comfortable reacting quickly in an emergency. °· Talk to your caregiver about resuming sexual activities. Resumption of sexual activities is dependent upon your risk of infection, your rate of healing, and your comfort and desire to resume sexual activity. Usually we recommend waiting about six weeks, or until your bleeding stops and you are interested in sex. °· Try to have someone  help you with your household activities and your newborn for at least a few days after you leave the hospital. Even longer is better. °· Rest as much as possible. Try to rest or take a nap when your newborn is sleeping. Sleep deprivation can be very hard after delivery. °· Increase your activities gradually. °· Keep all of your scheduled postpartum appointments. It is very important to keep your scheduled follow-up appointments. At these appointments, your caregiver will be checking to make sure that you are healing physically and emotionally. ° °SEEK MEDICAL CARE IF:  °· You are passing large clots from your vagina.  °· You have a foul smelling discharge from your vagina. °· You have trouble urinating. °· You are urinating frequently. °· You have pain when you urinate. °· You have a change in your bowel movements. °· You have increasing redness, pain, or swelling near your vaginal incision (episiotomy) or vaginal tear. °· You have pus draining from your episiotomy or vaginal tear. °· Your episiotomy or vaginal tear is separating. °· You have painful, hard, or reddened breasts. °· You have a severe headache. °· You have blurred vision or see spots. °· You feel sad or depressed. °· You have thoughts of hurting yourself or your newborn. °· You have questions about your care, the care of your newborn, or medicines. °· You are dizzy or light-headed. °· You have a rash. °· You have nausea or vomiting. °· You were breastfeeding and have not had a menstrual period within 12 weeks after you stopped breastfeeding. °· You are not breastfeeding and have not had a menstrual period by the 12th week after delivery. °· You   have a fever. ° °SEEK IMMEDIATE MEDICAL CARE IF:  °· You have persistent pain. °· You have chest pain. °· You have shortness of breath. °· You faint. °· You have leg pain. °· You have stomach pain. °· Your vaginal bleeding saturates two or more sanitary pads in 1 hour. ° °MAKE SURE YOU:  °· Understand these  instructions. °· Will get help right away if you are not doing well or get worse. °·  °Document Released: 12/06/2000 Document Revised: 04/25/2014 Document Reviewed: 08/05/2012 ° °ExitCare® Patient Information ©2015 [employer]. This information is not intended to replace advice given to you by your health care provider. Make sure you discuss any questions you have with your health care provider. ° °

## 2017-06-02 NOTE — Discharge Summary (Signed)
Obstetric Discharge Summary   Patient ID: Selena Lowe MRN: [id number] DOB/AGE: [birth year] 21 y.o.   Date of Admission: 06/01/2017  Date of Discharge: 06/04/17  Admitting Diagnosis: IUP  at [redacted]w[redacted]d  Secondary Diagnosis: Postdates IOL   Mode of Delivery: NSVD of viable female with 2nd degree lac with repair     Discharge Diagnosis: NSVD of viable female    Intrapartum Procedures: Pitocin, IUPC, IFSE,    Post partum procedures: None  Complications: None   Brief Hospital Course  Selena Lowe is a G53P0 who had a SVD on 06/02/17;  for further details of this delivery, please refer to the delivery note.  Patient had an uncomplicated postpartum course.  By time of discharge on PPD#2, her pain was controlled on oral pain medications; she had appropriate lochia and was ambulating, voiding without difficulty and tolerating regular diet.  She was deemed stable for discharge to home.   .    Labs: CBC Latest Ref Rng & Units 06/03/2017 06/01/2017 03/11/2017  WBC 3.6 - 11.0 K/uL 18.0(H) 15.2(H) 14.8(H)  Hemoglobin 12.0 - 16.0 g/dL 9.7(L) 11.9(L) 11.8(L)  Hematocrit 35.0 - 47.0 % 30.0(L) 36.2 35.6  Platelets 150 - 440 K/uL 273 305 307   B POS  Physical exam:  Blood pressure 121/80, pulse 88, temperature 98.1 F (36.7 C), temperature source Oral, resp. rate 18, height 5\' 2"  (1.575 m), weight 112 kg (247 lb), SpO2 98 %. General: alert and no distress Lochia: appropriate Abdomen: soft, NT Uterine Fundus: firm Perineum: healing and intact.  Extremities: No evidence of DVT seen on physical exam. No lower extremity edema.  Discharge Instructions: Per After Visit Summary. Activity: Advance as tolerated. Pelvic rest for 6 weeks.  Also refer to After Visit Summary Diet: Regular Medications:varivax , depo provera before d/c   Outpatient follow up:  Follow-up Information    Selena Lowe, CNM Follow up in 6 week(s).   Specialty:  Obstetrics and Gynecology Contact information: [address] [facility] [place] [address] [phone]         6 weeks at [facility] Postpartum contraception: LARC  Discharged Condition: Stable   Discharged to: HOME   Newborn Data:  Baby "Girl" named "Selena Lowe"  Disposition:Home   Apgars: APGAR (1 MIN): 7   APGAR (5 MINS): 8   APGAR (10 MINS):    Baby Feeding: Breast Selena Lowe, Selena Lowe 06/04/2017

## 2017-06-02 NOTE — Progress Notes (Addendum)
S: Resting comfortably with epidural. + CTX, no LOF, VB O: Vitals:   06/02/17 0515 06/02/17 0520 06/02/17 0656 06/02/17 0755  BP: 131/74  (!) 98/52   Pulse: 87  80   Resp:    18  Temp: 97.9 F (36.6 C)     TempSrc: Oral     SpO2: 98% 97%    Weight:      Height:         Gen: NAD, AAOx3      Abd: FNTTP      Ext: Non-tender, Nonedmeatous    FHT: mod var + accelerations, +early  decelerations TOCO: Q 2-3  Min. SVE: 3 cms per RN report    A/P:  21 y.o. yo G1P0 at [redacted]w[redacted]d for IOL for post-dates.   Labor: progressing   FWB: Reassuring Cat 1 tracing. EFW 7#14oz  GBS: neg  Contraception: LARC   Catheryn Bacon 8:20 AM

## 2017-06-02 NOTE — Anesthesia Procedure Notes (Signed)
Epidural Patient location during procedure: OB Start time: 06/02/2017 4:52 AM End time: 06/02/2017 5:03 AM  Staffing Anesthesiologist: Martha Clan Performed: anesthesiologist   Preanesthetic Checklist Completed: patient identified, site marked, surgical consent, pre-op evaluation, timeout performed, IV checked, risks and benefits discussed and monitors and equipment checked  Epidural Patient position: sitting Prep: ChloraPrep Patient monitoring: heart rate, continuous pulse ox and blood pressure Approach: midline Location: L3-L4 Injection technique: LOR saline  Needle:  Needle type: Tuohy  Needle gauge: 17 G Needle length: 9 cm and 9 Needle insertion depth: 7.5 cm Catheter type: closed end flexible Catheter size: 19 Gauge Catheter at skin depth: 12.5 cm Test dose: negative and 1.5% lidocaine with Epi 1:200 K  Assessment Sensory level: T10 Events: blood not aspirated, injection not painful, no injection resistance, negative IV test and no paresthesia  Additional Notes Pt. Evaluated and documentation done after procedure finished. Patient identified. Risks/Benefits/Options discussed with patient including but not limited to bleeding, infection, nerve damage, paralysis, failed block, incomplete pain control, headache, blood pressure changes, nausea, vomiting, reactions to medication both or allergic, itching and postpartum back pain. Confirmed with bedside nurse the patient's most recent platelet count. Confirmed with patient that they are not currently taking any anticoagulation, have any bleeding history or any family history of bleeding disorders. Patient expressed understanding and wished to proceed. All questions were answered. Sterile technique was used throughout the entire procedure. Please see nursing notes for vital signs. Test dose was given through epidural catheter and negative prior to continuing to dose epidural or start infusion. Warning signs of high block given to  the patient including shortness of breath, tingling/numbness in hands, complete motor block, or any concerning symptoms with instructions to call for help. Patient was given instructions on fall risk and not to get out of bed. All questions and concerns addressed with instructions to call with any issues or inadequate analgesia.   Patient tolerated the insertion well without immediate complications.Reason for block:procedure for pain

## 2017-06-02 NOTE — Progress Notes (Signed)
S: Resting comfortably without epidural. + CTX, no LOF, VB O: Vitals:   06/02/17 0520 06/02/17 0656 06/02/17 0755 06/02/17 0915  BP:  (!) 98/52    Pulse:  80    Resp:   18 18  Temp:    97.9 F (36.6 C)  TempSrc:    Oral  SpO2: 97%     Weight:      Height:        Gen: NAD, AAOx3      Abd: FNTTP      Ext: Non-tender, Nonedmeatous    FHT:+ mod var + accelerations + early  decelerations TOCO: Q 2  min SVE: 6/100/vtx-2 AROM performed for clear fluid with mod amt.   A/P:  21 y.o. yo G1P0 at [redacted]w[redacted]d for IOL.   Labor: progressing   FWB: Reassuring Cat 1 tracing.   GBS: neg     Catheryn Bacon 10:03 AM

## 2017-06-02 NOTE — Anesthesia Preprocedure Evaluation (Signed)
Anesthesia Evaluation  Patient identified by MRN, date of birth, ID band Patient awake    Reviewed: Allergy & Precautions, H&P , NPO status , Patient's Chart, lab work & pertinent test results, reviewed documented beta blocker date and time   History of Anesthesia Complications Negative for: history of anesthetic complications  Airway Mallampati: III  TM Distance: <3 FB Neck ROM: full    Dental  (+) Dental Advidsory Given   Pulmonary neg shortness of breath, asthma , neg recent URI,           Cardiovascular Exercise Tolerance: Good negative cardio ROS       Neuro/Psych PSYCHIATRIC DISORDERS (ADHD, PTSD, Depression) negative neurological ROS     GI/Hepatic Neg liver ROS, GERD  ,  Endo/Other  neg diabetesMorbid obesity  Renal/GU negative Renal ROS  negative genitourinary   Musculoskeletal   Abdominal   Peds  Hematology negative hematology ROS (+)   Anesthesia Other Findings Past Medical History: No date: Asthma No date: Attention deficit hyperactivity disorder (ADHD) No date: Depression   Reproductive/Obstetrics (+) Pregnancy                             Anesthesia Physical Anesthesia Plan  ASA: III  Anesthesia Plan: Epidural   Post-op Pain Management:    Induction:   PONV Risk Score and Plan:   Airway Management Planned:   Additional Equipment:   Intra-op Plan:   Post-operative Plan:   Informed Consent: I have reviewed the patients History and Physical, chart, labs and discussed the procedure including the risks, benefits and alternatives for the proposed anesthesia with the patient or authorized representative who has indicated his/her understanding and acceptance.   Dental Advisory Given  Plan Discussed with: Anesthesiologist, CRNA and Surgeon  Anesthesia Plan Comments:         Anesthesia Quick Evaluation

## 2017-06-02 NOTE — Progress Notes (Signed)
0425 MD notified for epidural  0432 Dr Rosey Bath In Room 0458Test Dose 99 Maternal HR 0505Bolus 1 0509Bolus 2  0510Drip started

## 2017-06-03 LAB — CBC
HEMATOCRIT: 30 % — AB (ref 35.0–47.0)
HEMOGLOBIN: 9.7 g/dL — AB (ref 12.0–16.0)
MCH: 25.3 pg — ABNORMAL LOW (ref 26.0–34.0)
MCHC: 32.4 g/dL (ref 32.0–36.0)
MCV: 78.2 fL — AB (ref 80.0–100.0)
Platelets: 273 10*3/uL (ref 150–440)
RBC: 3.83 MIL/uL (ref 3.80–5.20)
RDW: 15.2 % — AB (ref 11.5–14.5)
WBC: 18 10*3/uL — ABNORMAL HIGH (ref 3.6–11.0)

## 2017-06-03 LAB — RPR: RPR Ser Ql: NONREACTIVE

## 2017-06-03 NOTE — Care Management Note (Signed)
Case Management Note  Patient Details  Name: Selena Lowe MRN: [id number] Date of Birth: 27-May-1996  Subjective/Objective:      Wants more information about Medicaid for baby Lelan Pons.  Mother has maternity medicaid.              Action/Plan: discussed going to the Department of Social Services and starting the medicaid application progress for the newborn.    Expected Discharge Date:                  Expected Discharge Plan:     In-House Referral:   yes  Discharge planning Services   no  Post Acute Care Choice:    Choice offered to:     DME Arranged:   no DME Agency:     HH Arranged:   no HH Agency:     Status of Service:   Complete  If discussed at Long Length of Stay Meetings, dates discussed:    Additional Comments:  Shelbie Ammons, RN MSN CCM Care Management [phone] 06/03/2017, 9:35 AM

## 2017-06-03 NOTE — Progress Notes (Signed)
Post Partum Day 1 Subjective: no complaints, up ad lib, voiding and tolerating PO  Objective: Blood pressure 135/81, pulse (!) 111, temperature 98 F (36.7 C), temperature source Oral, resp. rate 18, height 5\' 2"  (1.575 m), weight 112 kg (247 lb), SpO2 98 %.  Physical Exam:  General: alert, cooperative and appears stated age Lochia: appropriate Uterine Fundus: firm DVT Evaluation: No evidence of DVT seen on physical exam. Negative Homan's sign. No cords or calf tenderness.   Recent Labs  06/01/17 2122 06/03/17 0510  HGB 11.9* 9.7*  HCT 36.2 30.0*    Assessment/Plan: Plan for discharge tomorrow  Bottle feeding Iron for anemia   LOS: 2 days   Benjaman Kindler 06/03/2017, 8:42 AM

## 2017-06-03 NOTE — Anesthesia Postprocedure Evaluation (Signed)
Anesthesia Post Note  Patient: Selena Lowe  Procedure(s) Performed: * No procedures listed *  Patient location during evaluation: Mother Baby Anesthesia Type: Epidural Level of consciousness: awake and alert Pain management: pain level controlled Vital Signs Assessment: post-procedure vital signs reviewed and stable Respiratory status: spontaneous breathing, nonlabored ventilation and respiratory function stable Cardiovascular status: stable Postop Assessment: no headache, no backache and epidural receding Anesthetic complications: no     Last Vitals:  Vitals:   06/03/17 0344 06/03/17 0717  BP: 115/68 135/81  Pulse: (!) 108 (!) 111  Resp: 18 18  Temp: 36.5 C 36.7 C    Last Pain:  Vitals:   06/03/17 0717  TempSrc: Oral  PainSc: (P) 1                  Estill Batten

## 2017-06-04 MED ORDER — VARICELLA VIRUS VACCINE LIVE 1350 PFU/0.5ML IJ SUSR
0.5000 mL | Freq: Once | INTRAMUSCULAR | Status: AC
  Administered 2017-06-04: 0.5 mL via SUBCUTANEOUS
  Filled 2017-06-04 (×3): qty 0.5

## 2017-06-04 MED ORDER — MEDROXYPROGESTERONE ACETATE 150 MG/ML IM SUSP: 150.0000 mg | Freq: Once | INTRAMUSCULAR | 3 refills | Status: DC

## 2017-06-04 MED ORDER — BENZOCAINE-MENTHOL 20-0.5 % EX AERO: 1.0000 "application " | INHALATION_SPRAY | CUTANEOUS | 1 refills | Status: DC | PRN

## 2017-06-04 MED ORDER — IBUPROFEN 600 MG PO TABS: 600.0000 mg | ORAL_TABLET | Freq: Four times a day (QID) | ORAL | 0 refills | Status: DC

## 2017-06-04 MED ORDER — MEDROXYPROGESTERONE ACETATE 150 MG/ML IM SUSP
150.0000 mg | Freq: Once | INTRAMUSCULAR | Status: AC
  Administered 2017-06-04: 150 mg via INTRAMUSCULAR
  Filled 2017-06-04: qty 1

## 2017-06-04 NOTE — Progress Notes (Signed)
Patient discharged home with infant and spouse. Discharge instructions, prescriptions and follow up appointment given to and reviewed with patient and spouse. Patient verbalized understanding. Escorted out via wheelchair by auxiliary.  

## 2018-01-02 DIAGNOSIS — F909 Attention-deficit hyperactivity disorder, unspecified type: Secondary | ICD-10-CM | POA: Diagnosis not present

## 2018-01-02 DIAGNOSIS — R5383 Other fatigue: Secondary | ICD-10-CM | POA: Diagnosis not present

## 2018-01-02 DIAGNOSIS — F339 Major depressive disorder, recurrent, unspecified: Secondary | ICD-10-CM | POA: Diagnosis not present

## 2018-01-02 DIAGNOSIS — F4310 Post-traumatic stress disorder, unspecified: Secondary | ICD-10-CM | POA: Diagnosis not present

## 2018-01-27 ENCOUNTER — Inpatient Hospital Stay: Payer: Medicaid Other | Attending: Oncology | Admitting: Oncology

## 2018-01-27 ENCOUNTER — Encounter: Payer: Self-pay | Admitting: Oncology

## 2018-01-27 ENCOUNTER — Other Ambulatory Visit: Payer: Self-pay

## 2018-01-27 ENCOUNTER — Inpatient Hospital Stay: Payer: Medicaid Other

## 2018-01-27 ENCOUNTER — Encounter (INDEPENDENT_AMBULATORY_CARE_PROVIDER_SITE_OTHER): Payer: Self-pay

## 2018-01-27 VITALS — BP 111/79 | HR 86 | Temp 99.5°F | Resp 16 | Wt 222.4 lb

## 2018-01-27 DIAGNOSIS — R718 Other abnormality of red blood cells: Secondary | ICD-10-CM | POA: Diagnosis not present

## 2018-01-27 DIAGNOSIS — F909 Attention-deficit hyperactivity disorder, unspecified type: Secondary | ICD-10-CM | POA: Insufficient documentation

## 2018-01-27 DIAGNOSIS — Z808 Family history of malignant neoplasm of other organs or systems: Secondary | ICD-10-CM | POA: Diagnosis not present

## 2018-01-27 DIAGNOSIS — Z801 Family history of malignant neoplasm of trachea, bronchus and lung: Secondary | ICD-10-CM | POA: Diagnosis not present

## 2018-01-27 DIAGNOSIS — D75839 Thrombocytosis, unspecified: Secondary | ICD-10-CM

## 2018-01-27 DIAGNOSIS — D473 Essential (hemorrhagic) thrombocythemia: Secondary | ICD-10-CM

## 2018-01-27 DIAGNOSIS — E611 Iron deficiency: Secondary | ICD-10-CM | POA: Insufficient documentation

## 2018-01-27 DIAGNOSIS — Z809 Family history of malignant neoplasm, unspecified: Secondary | ICD-10-CM | POA: Diagnosis not present

## 2018-01-27 NOTE — Progress Notes (Signed)
Hematology/Oncology Consult note [facility] Telephone:([phone] [phone]   Patient Care Team: Remi Haggard, FNP as PCP - General (Family Medicine)  REFERRING PROVIDER: Remi Haggard, FNP CHIEF COMPLAINTS/PURPOSE OF CONSULTATION:  Evaluation of thrombocytosis.  HISTORY OF PRESENTING ILLNESS:  Selena Lowe is a  22 y.o.  female with PMH listed below who was referred to me for evaluation of thrombocytosis.  Patient had lab work done on January 02, 2018, CBC showed WBC 10.2, hemoglobin 13.2, MCV 79, platelet counts 429, differential normal, B12 377, folate more than 23.4,  Patient reports feeling well at baseline.  Denies any fatigue, weight loss, night sweats, personal history of clots.  She has a 22-month old daughter.  She is on Depakote shots.  Denies any blood in the urine or stool.   Review of Systems  Constitutional: Negative for chills, fever, malaise/fatigue and weight loss.  HENT: Negative for ear pain, hearing loss and tinnitus.   Eyes: Negative for blurred vision and pain.  Respiratory: Negative for cough and hemoptysis.   Cardiovascular: Negative for chest pain, palpitations and claudication.  Gastrointestinal: Negative for abdominal pain, nausea and vomiting.  Genitourinary: Negative for dysuria and urgency.  Musculoskeletal: Negative for myalgias and neck pain.  Skin: Negative for rash.  Neurological: Negative for dizziness and tremors.  Endo/Heme/Allergies: Does not bruise/bleed easily.  Psychiatric/Behavioral: Negative for depression and substance abuse.    MEDICAL HISTORY:  Past Medical History:  Diagnosis Date  . Asthma   . Attention deficit hyperactivity disorder (ADHD)   . Depression     SURGICAL HISTORY: No past surgical history on file.  SOCIAL HISTORY: Social History   Socioeconomic History  . Marital status: Single    Spouse name: Not on file  . Number of children: Not on file  . Years of  education: Not on file  . Highest education level: Not on file  Social Needs  . Financial resource strain: Not on file  . Food insecurity - worry: Not on file  . Food insecurity - inability: Not on file  . Transportation needs - medical: Not on file  . Transportation needs - non-medical: Not on file  Occupational History  . Not on file  Tobacco Use  . Smoking status: Never Smoker  . Smokeless tobacco: Never Used  Substance and Sexual Activity  . Alcohol use: No  . Drug use: No  . Sexual activity: Yes  Other Topics Concern  . Not on file  Social History Narrative  . Not on file    FAMILY HISTORY: Family History  Problem Relation Age of Onset  . Cancer Maternal Grandmother     ALLERGIES:  is allergic to penicillins.  MEDICATIONS:  Current Outpatient Medications  Medication Sig Dispense Refill  . benzocaine-Menthol (DERMOPLAST) 20-0.5 % AERO Apply 1 application topically as needed for irritation (perineal discomfort). 1 each 1  . ibuprofen (ADVIL,MOTRIN) 600 MG tablet Take 1 tablet (600 mg total) by mouth every 6 (six) hours. 30 tablet 0  . medroxyPROGESTERone (DEPO-PROVERA) 150 MG/ML injection Inject 1 mL (150 mg total) into the muscle once. 1 mL 3  . prenatal vitamin w/FE, FA (PRENATAL 1 + 1) 27-1 MG TABS tablet Take 1 tablet by mouth daily at 12 noon.     No current facility-administered medications for this visit.      PHYSICAL EXAMINATION: ECOG PERFORMANCE STATUS: 0 - Asymptomatic Vitals:   01/27/18 1201  BP: 111/79  Pulse: 86  Resp: 16  Temp: 99.5  F (37.5 C)   Filed Weights   01/27/18 1201  Weight: 222 lb 6 oz (100.9 kg)    Physical Exam  Constitutional: She is oriented to person, place, and time and well-developed, well-nourished, and in no distress. No distress.  HENT:  Head: Normocephalic and atraumatic.  Mouth/Throat: No oropharyngeal exudate.  Eyes: EOM are normal. Pupils are equal, round, and reactive to light. No scleral icterus.  Neck:  Normal range of motion. Neck supple.  Cardiovascular: Normal rate and normal heart sounds.  No murmur heard. Pulmonary/Chest: Effort normal and breath sounds normal. She exhibits no tenderness.  Abdominal: Soft. Bowel sounds are normal. She exhibits no distension.  Musculoskeletal: Normal range of motion. She exhibits no edema.  Lymphadenopathy:    She has no cervical adenopathy.  Neurological: She is alert and oriented to person, place, and time.  Skin: Skin is warm and dry.  Psychiatric: Affect and judgment normal.     LABORATORY DATA:  I have reviewed the data as listed Lab Results  Component Value Date   WBC 18.0 (H) 06/03/2017   HGB 9.7 (L) 06/03/2017   HCT 30.0 (L) 06/03/2017   MCV 78.2 (L) 06/03/2017   PLT 273 06/03/2017   Recent Labs    03/11/17 2049 06/01/17 2122  NA 136 137  K 3.8 4.3  CL 108 105  CO2 21* 23  GLUCOSE 84 97  BUN 5* 8  CREATININE 0.41* 0.52  CALCIUM 8.7* 10.4*  GFRNONAA >60 >60  GFRAA >60 >60  PROT 6.7 6.4*  ALBUMIN 2.8* 2.3*  AST 21 28  ALT 12* 12*  ALKPHOS 102 192*  BILITOT 0.2* 0.2*       ASSESSMENT & PLAN:  1. Thrombocytosis ([place])   2. Microcytosis    Discussed with patient that mild thrombocytosis can be reactive to infection/iron deficiency, etc. versus secondary to underlying bone marrow disorder.  I will start with ordering basic lab work including CBC, smear review, CMP, iron, ferritin.  All questions were answered. The patient knows to call the clinic with any problems questions or concerns.  Return of visit: 2 weeks to discuss lab results. Thank you for this kind referral and the opportunity to participate in the care of this patient. A copy of today's note is routed to referring provider    Earlie Server, MD, PhD Hematology Oncology [facility] at [facility] Pager- [phone] 01/27/2018

## 2018-01-27 NOTE — Progress Notes (Signed)
Patient here today as a new patient  

## 2018-02-12 ENCOUNTER — Inpatient Hospital Stay: Payer: Medicaid Other | Admitting: Oncology

## 2018-02-17 NOTE — Progress Notes (Addendum)
Hematology/Oncology  Follow up note [facility] [phone] [phone]   Patient Care Team: Remi Haggard, FNP as PCP - General (Family Medicine)  REFERRING PROVIDER: Remi Haggard, FNP CHIEF COMPLAINTS/PURPOSE OF CONSULTATION:  Evaluation of thrombocytosis.  HISTORY OF PRESENTING ILLNESS:  Selena Lowe is a  22 y.o.  female with PMH listed below who was referred to me for evaluation of thrombocytosis.  Patient had lab work done on January 02, 2018, CBC showed WBC 10.2, hemoglobin 13.2, MCV 79, platelet counts 429, differential normal, B12 377, folate more than 23.4,  Patient reports feeling well at baseline.  Denies any fatigue, weight loss, night sweats, personal history of clots.  She has a 63-month old daughter.  She is on Depakote shots.  Denies any blood in the urine or stool.  INTERVAL HISTORY Selena Lowe is a 22 y.o. female who has above history reviewed by me today presents for follow up visit for management of thrombocytosis.  She was supposed to have lab work up done which she has not yet. She says she is busy as a new mother. No new complaints. Continue feeling well, some fatigue but attribute to busy parenting life.     Review of Systems  Constitutional: Positive for malaise/fatigue. Negative for chills, fever and weight loss.  HENT: Negative for ear pain, hearing loss, nosebleeds and tinnitus.   Eyes: Negative for blurred vision, photophobia and pain.  Respiratory: Negative for cough and hemoptysis.   Cardiovascular: Negative for chest pain, palpitations and claudication.  Gastrointestinal: Negative for abdominal pain, heartburn, nausea and vomiting.  Genitourinary: Negative for dysuria and urgency.  Musculoskeletal: Negative for myalgias and neck pain.  Skin: Negative for rash.  Neurological: Negative for dizziness, tingling and tremors.  Endo/Heme/Allergies: Negative for environmental allergies. Does not  bruise/bleed easily.  Psychiatric/Behavioral: Negative for depression and substance abuse. The patient is not nervous/anxious.     MEDICAL HISTORY:  Past Medical History:  Diagnosis Date  . Asthma   . Attention deficit hyperactivity disorder (ADHD)   . Depression   . Hyperlipidemia   . Vitamin D deficiency     SURGICAL HISTORY: No past surgical history on file.  SOCIAL HISTORY: Social History   Socioeconomic History  . Marital status: Single    Spouse name: Not on file  . Number of children: Not on file  . Years of education: Not on file  . Highest education level: Not on file  Social Needs  . Financial resource strain: Not on file  . Food insecurity - worry: Not on file  . Food insecurity - inability: Not on file  . Transportation needs - medical: Not on file  . Transportation needs - non-medical: Not on file  Occupational History  . Not on file  Tobacco Use  . Smoking status: Never Smoker  . Smokeless tobacco: Never Used  Substance and Sexual Activity  . Alcohol use: No  . Drug use: No  . Sexual activity: Yes  Other Topics Concern  . Not on file  Social History Narrative  . Not on file    FAMILY HISTORY: Family History  Problem Relation Age of Onset  . Cervical cancer Maternal Grandmother   . Lung cancer Maternal Grandmother   . Clotting disorder Maternal Grandmother   . Diabetes Maternal Grandmother     ALLERGIES:  is allergic to penicillins.  MEDICATIONS:  Current Outpatient Medications  Medication Sig Dispense Refill  . aspirin EC 81 MG tablet Take by  mouth.    . ergocalciferol (VITAMIN D2) 50000 units capsule Take 50,000 Units by mouth once a week.    Marland Kitchen ibuprofen (ADVIL,MOTRIN) 600 MG tablet Take 1 tablet (600 mg total) by mouth every 6 (six) hours. 30 tablet 0  . medroxyPROGESTERone (DEPO-PROVERA) 150 MG/ML injection Inject into the muscle.    . simvastatin (ZOCOR) 20 MG tablet Take 20 mg by mouth daily.     No current facility-administered  medications for this visit.      PHYSICAL EXAMINATION: ECOG PERFORMANCE STATUS: 0 - Asymptomatic Vitals:   02/18/18 1507  BP: 133/83  Pulse: 97  Temp: 99.4 F (37.4 C)   Filed Weights   02/18/18 1507  Weight: 229 lb 6 oz (104 kg)    Physical Exam  Constitutional: She is oriented to person, place, and time and well-developed, well-nourished, and in no distress. No distress.  HENT:  Head: Normocephalic and atraumatic.  Mouth/Throat: No oropharyngeal exudate.  Eyes: EOM are normal. Pupils are equal, round, and reactive to light. No scleral icterus.  Neck: Normal range of motion. Neck supple.  Cardiovascular: Normal rate and normal heart sounds.  No murmur heard. Pulmonary/Chest: Effort normal and breath sounds normal. She exhibits no tenderness.  Abdominal: Soft. Bowel sounds are normal. She exhibits no distension.  Musculoskeletal: Normal range of motion. She exhibits no edema.  Lymphadenopathy:    She has no cervical adenopathy.  Neurological: She is alert and oriented to person, place, and time.  Skin: Skin is warm and dry.  Psychiatric: Affect and judgment normal.     LABORATORY DATA:  I have reviewed the data as listed Lab Results  Component Value Date   WBC 10.6 02/18/2018   HGB 13.2 02/18/2018   HCT 39.9 02/18/2018   MCV 78.0 (L) 02/18/2018   PLT 403 02/18/2018   Recent Labs    03/11/17 2049 06/01/17 2122 02/18/18 1525  NA 136 137 138  K 3.8 4.3 4.0  CL 108 105 105  CO2 21* 23 23  GLUCOSE 84 97 121*  BUN 5* 8 14  CREATININE 0.41* 0.52 0.50  CALCIUM 8.7* 10.4* 9.7  GFRNONAA >60 >60 >60  GFRAA >60 >60 >60  PROT 6.7 6.4* 7.7  ALBUMIN 2.8* 2.3* 3.7  AST 21 28 20   ALT 12* 12* 16  ALKPHOS 102 192* 137*  BILITOT 0.2* 0.2* 0.2*       ASSESSMENT & PLAN:  1. Thrombocytosis ([place])   2. Microcytosis   3. Iron deficiency anemia, unspecified iron deficiency anemia type    Labs indicates iron deficiency Thrombocytosis is likely due to iron  deficiency. IV iron was discussed and patient is not interested.  Will start oral iron supplementation Rx.  All questions were answered. The patient knows to call the clinic with any problems questions or concerns.  Return of visit: 6 weeks.    Earlie Server, MD, PhD Hematology Oncology [facility] Pager- [phone] 02/18/2018

## 2018-02-18 ENCOUNTER — Other Ambulatory Visit: Payer: Self-pay

## 2018-02-18 ENCOUNTER — Inpatient Hospital Stay (HOSPITAL_BASED_OUTPATIENT_CLINIC_OR_DEPARTMENT_OTHER): Payer: Medicaid Other | Admitting: Oncology

## 2018-02-18 ENCOUNTER — Encounter: Payer: Self-pay | Admitting: Oncology

## 2018-02-18 ENCOUNTER — Inpatient Hospital Stay: Payer: Medicaid Other

## 2018-02-18 VITALS — BP 133/83 | HR 97 | Temp 99.4°F | Wt 229.4 lb

## 2018-02-18 DIAGNOSIS — Z801 Family history of malignant neoplasm of trachea, bronchus and lung: Secondary | ICD-10-CM

## 2018-02-18 DIAGNOSIS — D75839 Thrombocytosis, unspecified: Secondary | ICD-10-CM

## 2018-02-18 DIAGNOSIS — E611 Iron deficiency: Secondary | ICD-10-CM | POA: Diagnosis not present

## 2018-02-18 DIAGNOSIS — D473 Essential (hemorrhagic) thrombocythemia: Secondary | ICD-10-CM | POA: Diagnosis not present

## 2018-02-18 DIAGNOSIS — Z808 Family history of malignant neoplasm of other organs or systems: Secondary | ICD-10-CM

## 2018-02-18 DIAGNOSIS — R718 Other abnormality of red blood cells: Secondary | ICD-10-CM

## 2018-02-18 DIAGNOSIS — D509 Iron deficiency anemia, unspecified: Secondary | ICD-10-CM

## 2018-02-18 LAB — COMPREHENSIVE METABOLIC PANEL
ALT: 16 U/L (ref 14–54)
AST: 20 U/L (ref 15–41)
Albumin: 3.7 g/dL (ref 3.5–5.0)
Alkaline Phosphatase: 137 U/L — ABNORMAL HIGH (ref 38–126)
Anion gap: 10 (ref 5–15)
BILIRUBIN TOTAL: 0.2 mg/dL — AB (ref 0.3–1.2)
BUN: 14 mg/dL (ref 6–20)
CO2: 23 mmol/L (ref 22–32)
Calcium: 9.7 mg/dL (ref 8.9–10.3)
Chloride: 105 mmol/L (ref 101–111)
Creatinine, Ser: 0.5 mg/dL (ref 0.44–1.00)
GFR calc Af Amer: >60 mL/min (ref >60)
Glucose, Bld: 121 mg/dL — ABNORMAL HIGH (ref 65–99)
Potassium: 4 mmol/L (ref 3.5–5.1)
Sodium: 138 mmol/L (ref 135–145)
TOTAL PROTEIN: 7.7 g/dL (ref 6.5–8.1)

## 2018-02-18 LAB — CBC WITH DIFFERENTIAL/PLATELET
Basophils Absolute: 0.2 10*3/uL — ABNORMAL HIGH (ref 0–0.1)
Basophils Relative: 2 %
EOS ABS: 0.2 10*3/uL (ref 0–0.7)
Eosinophils Relative: 2 %
HCT: 39.9 % (ref 35.0–47.0)
Hemoglobin: 13.2 g/dL (ref 12.0–16.0)
LYMPHS ABS: 2.6 10*3/uL (ref 1.0–3.6)
Lymphocytes Relative: 24 %
MCH: 25.9 pg — AB (ref 26.0–34.0)
MCHC: 33.2 g/dL (ref 32.0–36.0)
MCV: 78 fL — ABNORMAL LOW (ref 80.0–100.0)
Monocytes Absolute: 0.6 10*3/uL (ref 0.2–0.9)
Monocytes Relative: 5 %
Neutro Abs: 7.1 10*3/uL — ABNORMAL HIGH (ref 1.4–6.5)
Neutrophils Relative %: 67 %
PLATELETS: 403 10*3/uL (ref 150–440)
RBC: 5.12 MIL/uL (ref 3.80–5.20)
RDW: 14.1 % (ref 11.5–14.5)
WBC: 10.6 10*3/uL (ref 3.6–11.0)

## 2018-02-18 LAB — IRON AND TIBC
IRON: 22 ug/dL — AB (ref 28–170)
Saturation Ratios: 5 % — ABNORMAL LOW (ref 10.4–31.8)
TIBC: 460 ug/dL — ABNORMAL HIGH (ref 250–450)
UIBC: 438 ug/dL

## 2018-02-18 LAB — TECHNOLOGIST SMEAR REVIEW

## 2018-02-18 LAB — FERRITIN: Ferritin: 26 ng/mL (ref 11–307)

## 2018-02-18 MED ORDER — DOCUSATE SODIUM 100 MG PO CAPS: 100.0000 mg | ORAL_CAPSULE | Freq: Every day | ORAL | 2 refills | Status: AC | PRN

## 2018-02-18 MED ORDER — FERROUS SULFATE 325 (65 FE) MG PO TBEC: 325.0000 mg | DELAYED_RELEASE_TABLET | Freq: Two times a day (BID) | ORAL | 3 refills | Status: AC

## 2018-02-18 NOTE — Progress Notes (Signed)
Patient here today for follow up. Patient states no new concerns today .  Patient states that she has discontinued the depo injection for birth control, she is now taking oral birth control pill, patient unsure of what the name is.

## 2018-02-18 NOTE — Addendum Note (Signed)
Addended by: Earlie Server on: 02/18/2018 08:27 PM   Modules accepted: Orders

## 2018-03-19 ENCOUNTER — Ambulatory Visit: Payer: Medicaid Other | Admitting: Oncology

## 2018-04-14 ENCOUNTER — Inpatient Hospital Stay: Payer: Self-pay | Attending: Oncology

## 2018-04-16 ENCOUNTER — Inpatient Hospital Stay: Payer: Self-pay | Admitting: Oncology

## 2018-05-09 ENCOUNTER — Emergency Department
Admission: EM | Admit: 2018-05-09 | Discharge: 2018-05-09 | Disposition: A | Discharge status: Home / Self Care | Payer: Medicaid Other | Attending: Emergency Medicine | Admitting: Emergency Medicine

## 2018-05-09 ENCOUNTER — Other Ambulatory Visit: Payer: Self-pay

## 2018-05-09 DIAGNOSIS — Z79899 Other long term (current) drug therapy: Secondary | ICD-10-CM | POA: Insufficient documentation

## 2018-05-09 DIAGNOSIS — H9201 Otalgia, right ear: Secondary | ICD-10-CM | POA: Diagnosis present

## 2018-05-09 DIAGNOSIS — H66001 Acute suppurative otitis media without spontaneous rupture of ear drum, right ear: Secondary | ICD-10-CM | POA: Diagnosis not present

## 2018-05-09 DIAGNOSIS — J45909 Unspecified asthma, uncomplicated: Secondary | ICD-10-CM | POA: Diagnosis not present

## 2018-05-09 LAB — GROUP A STREP BY PCR: GROUP A STREP BY PCR: NOT DETECTED

## 2018-05-09 MED ORDER — AZITHROMYCIN 250 MG PO TABS: ORAL_TABLET | ORAL | 0 refills | Status: DC

## 2018-05-09 NOTE — ED Provider Notes (Signed)
[facility] Provider Note  ____________________________________________  Time seen: Approximately 7:53 AM  I have reviewed the triage vital signs and the nursing notes.   HISTORY  Chief Complaint Otalgia and Sore Throat    HPI Selena Lowe is a 22 y.o. female who complains of right ear pain and sore throat for the past 2 days. Ear pain is gradually worsening, constant, nonradiating, no aggravating or alleviating factors. No recent trauma. No fevers chills or body aches. No swimming.  No throat swelling she does have a nonproductive cough. Denies shortness of breath. She does endorse severe seasonal allergies.      Past Medical History:  Diagnosis Date  . Asthma   . Attention deficit hyperactivity disorder (ADHD)   . Depression   . Hyperlipidemia   . Vitamin D deficiency      Patient Active Problem List   Diagnosis Date Noted  . ADHD (attention deficit hyperactivity disorder) 01/27/2018  . Indication for care in labor and delivery, delivered 06/02/2017  . Post term pregnancy 06/01/2017  . Vaginal spotting 03/11/2017     History reviewed. No pertinent surgical history.   Prior to Admission medications   Medication Sig Start Date End Date Taking? Authorizing Provider  aspirin EC 81 MG tablet Take by mouth.    [provider]  azithromycin (ZITHROMAX Z-PAK) 250 MG tablet Take 2 tablets (500 mg) on  Day 1,  followed by 1 tablet (250 mg) once daily on Days 2 through 5. 05/09/18   Carrie Mew, MD  docusate sodium (COLACE) 100 MG capsule Take 1 capsule (100 mg total) by mouth daily as needed for mild constipation. Do not take if you have loose stools 02/18/18   Earlie Server, MD  ergocalciferol (VITAMIN D2) 50000 units capsule Take 50,000 Units by mouth once a week.    [provider]  ferrous sulfate 325 (65 FE) MG EC tablet Take 1 tablet (325 mg total) by mouth 2 (two) times daily. 02/18/18   Earlie Server, MD   ibuprofen (ADVIL,MOTRIN) 600 MG tablet Take 1 tablet (600 mg total) by mouth every 6 (six) hours. 06/04/17   Schermerhorn, Gwen Her, MD  simvastatin (ZOCOR) 20 MG tablet Take 20 mg by mouth daily.    [provider]     Allergies Penicillins - anaphylaxis   Family History  Problem Relation Age of Onset  . Cervical cancer Maternal Grandmother   . Lung cancer Maternal Grandmother   . Clotting disorder Maternal Grandmother   . Diabetes Maternal Grandmother     Social History Social History   Tobacco Use  . Smoking status: Never Smoker  . Smokeless tobacco: Never Used  Substance Use Topics  . Alcohol use: No  . Drug use: No    Review of Systems  Constitutional:   No fever or chills.  ENT:   Positive sore throat. No rhinorrhea.positive right earache as above Cardiovascular:   No chest pain or syncope. Respiratory:   No dyspnea or cough. Gastrointestinal:   Negative for abdominal pain, vomiting and diarrhea.   All other systems reviewed and are negative except as documented above in ROS and HPI.  ____________________________________________   PHYSICAL EXAM:  VITAL SIGNS: ED Triage Vitals  Enc Vitals Group     BP 05/09/18 0549 (!) 135/92     Pulse Rate 05/09/18 0549 93     Resp 05/09/18 0549 17     Temp 05/09/18 0549 98.1 F (36.7 C)  Temp Source 05/09/18 0549 Oral     SpO2 05/09/18 0549 99 %     Weight 05/09/18 0551 195 lb (88.5 kg)     Height 05/09/18 0551 5\' 2"  (1.575 m)     Head Circumference --      Peak Flow --      Pain Score 05/09/18 0551 5     Pain Loc --      Pain Edu? --      Excl. in [place]? --     Vital signs reviewed, nursing assessments reviewed.   Constitutional:   Alert and oriented. Well appearing and in no distress. Eyes:   Conjunctivae are normal. EOMI. PERRL. ENT      Head:   Normocephalic and atraumatic.right TM bulging and erythematous, no light reflex. Left TM normal. No perforation or drainage.      Nose:   No  congestion/rhinnorhea.       Mouth/Throat:   MMM, mild pharyngeal erythema. No peritonsillar mass. tonsillitis symmetric. No uvula deviation. No tongue elevation, floor of mouth is soft      Neck:   No meningismus. Full ROM. Hematological/Lymphatic/Immunilogical:   No cervical lymphadenopathy. Cardiovascular:   RRR. Symmetric bilateral radial and DP pulses.  No murmurs.  Respiratory:   Normal respiratory effort without tachypnea/retractions. Breath sounds are clear and equal bilaterally. No wheezes/rales/rhonchi. Gastrointestinal:   Soft and nontender. Non distended. There is no CVA tenderness.  No rebound, rigidity, or guarding.  Musculoskeletal:   Normal range of motion in all extremities. No joint effusions.  No lower extremity tenderness.  No edema. Neurologic:   Normal speech and language.  Motor grossly intact. No acute focal neurologic deficits are appreciated.  Skin:    Skin is warm, dry and intact. No rash noted.  No petechiae, purpura, or bullae.  ____________________________________________    LABS (pertinent positives/negatives) (all labs ordered are listed, but only abnormal results are displayed) Labs Reviewed  GROUP A STREP BY PCR   ____________________________________________   EKG    ____________________________________________    RADIOLOGY  No results found.  ____________________________________________   PROCEDURES Procedures  ____________________________________________    CLINICAL IMPRESSION / ASSESSMENT AND PLAN / ED COURSE  Pertinent labs & imaging results that were available during my care of the patient were reviewed by me and considered in my medical decision making (see chart for details).    patient well-appearing no acute distress, presents with earache for the past 2 days. Vital signs are normal, exam overall is unremarkable except for finding consistent with unilateral otitis media on the right. No perforation. No evidence of  secondary infection or tonsillitis. Strep test is negative.  Given that she is 22 years old, has unilateral findings, has a cough and this is very likely to be viral in nature, I recommended an overall approach of watching and waiting. I'm providing her prescription for azithromycin, and if her symptoms are worsening over the next 48 hours or she develops a fever I have recommended she go ahead and fill the prescription and start taking it. Otherwise, if symptoms gradually improve, she understands that she will not need antibiotics and can continue taking NSAIDs to manage the discomfort.      ____________________________________________   FINAL CLINICAL IMPRESSION(S) / ED DIAGNOSES    Final diagnoses:  Non-recurrent acute suppurative otitis media of right ear without spontaneous rupture of tympanic membrane     ED Discharge Orders        Ordered    azithromycin [facility]  Z-PAK) 250 MG tablet     05/09/18 4458      Portions of this note were generated with dragon dictation software. Dictation errors may occur despite best attempts at proofreading.    Carrie Mew, MD 05/09/18 [id number]

## 2018-05-09 NOTE — ED Triage Notes (Signed)
Pt arrives to ED via POV from home with c/o RIGHT ear pain and sore throat x2 days.

## 2018-09-04 DIAGNOSIS — Z23 Encounter for immunization: Secondary | ICD-10-CM | POA: Diagnosis not present

## 2018-09-04 DIAGNOSIS — Z6839 Body mass index (BMI) 39.0-39.9, adult: Secondary | ICD-10-CM | POA: Diagnosis not present

## 2018-09-04 DIAGNOSIS — K219 Gastro-esophageal reflux disease without esophagitis: Secondary | ICD-10-CM | POA: Diagnosis not present

## 2018-09-25 DIAGNOSIS — K219 Gastro-esophageal reflux disease without esophagitis: Secondary | ICD-10-CM | POA: Diagnosis not present

## 2018-09-25 DIAGNOSIS — Z79899 Other long term (current) drug therapy: Secondary | ICD-10-CM | POA: Diagnosis not present

## 2018-09-25 DIAGNOSIS — Z112 Encounter for screening for other bacterial diseases: Secondary | ICD-10-CM | POA: Diagnosis not present

## 2018-09-25 DIAGNOSIS — R5383 Other fatigue: Secondary | ICD-10-CM | POA: Diagnosis not present

## 2018-09-25 DIAGNOSIS — R300 Dysuria: Secondary | ICD-10-CM | POA: Diagnosis not present

## 2018-09-25 DIAGNOSIS — N390 Urinary tract infection, site not specified: Secondary | ICD-10-CM | POA: Diagnosis not present

## 2018-09-25 DIAGNOSIS — Z20818 Contact with and (suspected) exposure to other bacterial communicable diseases: Secondary | ICD-10-CM | POA: Diagnosis not present

## 2019-01-05 DIAGNOSIS — R51 Headache: Secondary | ICD-10-CM | POA: Diagnosis not present

## 2019-01-05 DIAGNOSIS — J00 Acute nasopharyngitis [common cold]: Secondary | ICD-10-CM | POA: Diagnosis not present

## 2019-01-05 DIAGNOSIS — J069 Acute upper respiratory infection, unspecified: Secondary | ICD-10-CM | POA: Diagnosis not present

## 2019-01-05 DIAGNOSIS — R05 Cough: Secondary | ICD-10-CM | POA: Diagnosis not present

## 2019-01-05 DIAGNOSIS — J209 Acute bronchitis, unspecified: Secondary | ICD-10-CM | POA: Diagnosis not present

## 2019-01-05 DIAGNOSIS — Z1159 Encounter for screening for other viral diseases: Secondary | ICD-10-CM | POA: Diagnosis not present

## 2019-08-25 DIAGNOSIS — H52223 Regular astigmatism, bilateral: Secondary | ICD-10-CM | POA: Diagnosis not present

## 2019-08-26 DIAGNOSIS — H5213 Myopia, bilateral: Secondary | ICD-10-CM | POA: Diagnosis not present

## 2019-10-04 DIAGNOSIS — H52223 Regular astigmatism, bilateral: Secondary | ICD-10-CM | POA: Diagnosis not present

## 2020-12-23 NOTE — L&D Delivery Note (Deleted)
Operative Delivery Note  At  1711 on 10/4 /22 a viable female was delivered via low kielland forcep .  Presentation: vertex; Position: Left,, Occiput,, Transverse; Station:  +3/3 .  Verbal consent: obtained from patient.  Risks and benefits discussed in detail.  Risks include, but are not limited to the risks of anesthesia, bleeding, infection, damage to maternal tissues, fetal cephalhematoma.  There is also the risk of inability to effect vaginal delivery of the head, or shoulder dystocia that cannot be resolved by established maneuvers, leading to the need for emergency cesarean section.  APGAR: 8/9, ; weight  .   Placenta status: , .intact   Cord:  with the following complications: .none  Cord pH: n/a  Anesthesia:  cle Instruments: Kielland forceps  Episiotomy:  none Lacerations:  bilateral labial laceration  Suture Repair: 2.0 3.0 vicryl Est. Blood Loss (mL):  QBL 435 cc Forceps markings on fetal head with appropriate placement markings . No lacerations  Mom to postpartum.  Baby to Couplet care / Skin to Skin.  Gwen Her Claretta Kendra 09/25/2021, 6:01 PM

## 2021-05-21 DIAGNOSIS — F419 Anxiety disorder, unspecified: Secondary | ICD-10-CM | POA: Diagnosis not present

## 2021-05-21 DIAGNOSIS — Z91010 Allergy to peanuts: Secondary | ICD-10-CM | POA: Diagnosis not present

## 2021-05-21 DIAGNOSIS — Z880 Allergy status to penicillin: Secondary | ICD-10-CM | POA: Diagnosis not present

## 2021-05-21 DIAGNOSIS — R030 Elevated blood-pressure reading, without diagnosis of hypertension: Secondary | ICD-10-CM | POA: Diagnosis not present

## 2021-05-21 DIAGNOSIS — R0789 Other chest pain: Secondary | ICD-10-CM | POA: Diagnosis not present

## 2021-05-21 DIAGNOSIS — R079 Chest pain, unspecified: Secondary | ICD-10-CM | POA: Diagnosis not present

## 2021-06-10 DIAGNOSIS — Z3A01 Less than 8 weeks gestation of pregnancy: Secondary | ICD-10-CM | POA: Diagnosis not present

## 2021-06-10 DIAGNOSIS — R109 Unspecified abdominal pain: Secondary | ICD-10-CM | POA: Diagnosis not present

## 2021-06-10 DIAGNOSIS — N3000 Acute cystitis without hematuria: Secondary | ICD-10-CM | POA: Diagnosis not present

## 2021-06-10 DIAGNOSIS — O26891 Other specified pregnancy related conditions, first trimester: Secondary | ICD-10-CM | POA: Diagnosis not present

## 2021-07-04 DIAGNOSIS — N912 Amenorrhea, unspecified: Secondary | ICD-10-CM | POA: Diagnosis not present

## 2021-07-16 DIAGNOSIS — O3680X0 Pregnancy with inconclusive fetal viability, not applicable or unspecified: Secondary | ICD-10-CM | POA: Diagnosis not present

## 2021-07-16 DIAGNOSIS — O200 Threatened abortion: Secondary | ICD-10-CM | POA: Diagnosis not present

## 2021-07-16 DIAGNOSIS — Z3490 Encounter for supervision of normal pregnancy, unspecified, unspecified trimester: Secondary | ICD-10-CM | POA: Insufficient documentation

## 2021-07-30 DIAGNOSIS — Z3401 Encounter for supervision of normal first pregnancy, first trimester: Secondary | ICD-10-CM | POA: Diagnosis not present

## 2021-07-30 DIAGNOSIS — Z124 Encounter for screening for malignant neoplasm of cervix: Secondary | ICD-10-CM | POA: Diagnosis not present

## 2021-07-30 DIAGNOSIS — R87610 Atypical squamous cells of undetermined significance on cytologic smear of cervix (ASC-US): Secondary | ICD-10-CM | POA: Diagnosis not present

## 2021-07-30 DIAGNOSIS — Z6841 Body Mass Index (BMI) 40.0 and over, adult: Secondary | ICD-10-CM | POA: Diagnosis not present

## 2021-07-30 DIAGNOSIS — Z113 Encounter for screening for infections with a predominantly sexual mode of transmission: Secondary | ICD-10-CM | POA: Diagnosis not present

## 2021-08-28 DIAGNOSIS — Z6841 Body Mass Index (BMI) 40.0 and over, adult: Secondary | ICD-10-CM | POA: Diagnosis not present

## 2021-08-28 DIAGNOSIS — Z3401 Encounter for supervision of normal first pregnancy, first trimester: Secondary | ICD-10-CM | POA: Diagnosis not present

## 2021-08-28 DIAGNOSIS — Z23 Encounter for immunization: Secondary | ICD-10-CM | POA: Diagnosis not present

## 2021-09-25 ENCOUNTER — Observation Stay
Admission: EM | Admit: 2021-09-25 | Discharge: 2021-09-25 | Disposition: A | Discharge status: Home / Self Care | Payer: Medicaid Other | Attending: Obstetrics | Admitting: Obstetrics

## 2021-09-25 ENCOUNTER — Observation Stay: Payer: Medicaid Other

## 2021-09-25 ENCOUNTER — Other Ambulatory Visit: Payer: Self-pay

## 2021-09-25 DIAGNOSIS — O4592 Premature separation of placenta, unspecified, second trimester: Secondary | ICD-10-CM

## 2021-09-25 DIAGNOSIS — Z3A20 20 weeks gestation of pregnancy: Secondary | ICD-10-CM | POA: Insufficient documentation

## 2021-09-25 DIAGNOSIS — J45909 Unspecified asthma, uncomplicated: Secondary | ICD-10-CM | POA: Insufficient documentation

## 2021-09-25 DIAGNOSIS — O99212 Obesity complicating pregnancy, second trimester: Secondary | ICD-10-CM | POA: Diagnosis not present

## 2021-09-25 DIAGNOSIS — O368120 Decreased fetal movements, second trimester, not applicable or unspecified: Secondary | ICD-10-CM | POA: Diagnosis present

## 2021-09-25 DIAGNOSIS — Z79899 Other long term (current) drug therapy: Secondary | ICD-10-CM | POA: Insufficient documentation

## 2021-09-25 DIAGNOSIS — E669 Obesity, unspecified: Secondary | ICD-10-CM | POA: Insufficient documentation

## 2021-09-25 DIAGNOSIS — O99512 Diseases of the respiratory system complicating pregnancy, second trimester: Secondary | ICD-10-CM | POA: Diagnosis not present

## 2021-09-25 DIAGNOSIS — Z7982 Long term (current) use of aspirin: Secondary | ICD-10-CM | POA: Diagnosis not present

## 2021-09-25 DIAGNOSIS — O9A212 Injury, poisoning and certain other consequences of external causes complicating pregnancy, second trimester: Secondary | ICD-10-CM | POA: Diagnosis not present

## 2021-09-25 DIAGNOSIS — Z3A21 21 weeks gestation of pregnancy: Secondary | ICD-10-CM | POA: Diagnosis not present

## 2021-09-25 DIAGNOSIS — Z9181 History of falling: Secondary | ICD-10-CM

## 2021-09-25 DIAGNOSIS — T7491XA Unspecified adult maltreatment, confirmed, initial encounter: Secondary | ICD-10-CM | POA: Diagnosis present

## 2021-09-25 LAB — CBC
HCT: 35.4 % — ABNORMAL LOW (ref 36.0–46.0)
Hemoglobin: 12 g/dL (ref 12.0–15.0)
MCH: 28 pg (ref 26.0–34.0)
MCHC: 33.9 g/dL (ref 30.0–36.0)
MCV: 82.5 fL (ref 80.0–100.0)
Platelets: 269 10*3/uL (ref 150–400)
RBC: 4.29 MIL/uL (ref 3.87–5.11)
RDW: 12.8 % (ref 11.5–15.5)
WBC: 11.1 10*3/uL — ABNORMAL HIGH (ref 4.0–10.5)
nRBC: 0 % (ref 0.0–0.2)

## 2021-09-25 MED ORDER — DOCUSATE SODIUM 100 MG PO CAPS: 100.0000 mg | ORAL_CAPSULE | Freq: Every day | ORAL | Status: DC
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
## 2021-09-25 NOTE — Progress Notes (Signed)
Pt transported to ultrasound by RN

## 2021-09-25 NOTE — Progress Notes (Signed)
Discharge instructions provided to patient. Patient verbalized understanding. Patient states she has a safe place to stay. Red flag signs reviewed by RN. Patient discharged home.

## 2021-09-26 ENCOUNTER — Telehealth: Payer: Self-pay

## 2021-09-26 NOTE — Telephone Encounter (Signed)
Transition Care Management Unsuccessful Follow-up Telephone Call  Date of discharge and from where:  09/25/2021-[facility]  Attempts:  1st Attempt  Reason for unsuccessful TCM follow-up call:  Left voice message

## 2021-09-27 NOTE — Telephone Encounter (Signed)
Transition Care Management Follow-up Telephone Call Date of discharge and from where: 09/25/2021 from [facility] How have you been since you were released from the hospital? Pt state Any questions or concerns? No  Items Reviewed: Did the pt receive and understand the discharge instructions provided? Yes  Medications obtained and verified? Yes  Other? No  Any new allergies since your discharge? No  Dietary orders reviewed? No Do you have support at home? Yes   Functional Questionnaire: (I = Independent and D = Dependent) ADLs: I  Bathing/Dressing- I  Meal Prep- I  Eating- I  Maintaining continence- I  Transferring/Ambulation- I  Managing Meds- I   Follow up appointments reviewed:  PCP Hospital f/u appt confirmed? No   Specialist Hospital f/u appt confirmed? Yes  Scheduled to see [facility] on 10/01/2021 @ 8:30am. Are transportation arrangements needed? No  If their condition worsens, is the pt aware to call PCP or go to the Emergency Dept.? Yes Was the patient provided with contact information for the PCP's office or ED? Yes Was to pt encouraged to call back with questions or concerns? Yes

## 2021-11-06 ENCOUNTER — Other Ambulatory Visit: Payer: Medicaid Other | Attending: Certified Nurse Midwife

## 2021-11-26 DIAGNOSIS — O98313 Other infections with a predominantly sexual mode of transmission complicating pregnancy, third trimester: Secondary | ICD-10-CM | POA: Diagnosis not present

## 2021-11-26 DIAGNOSIS — O99213 Obesity complicating pregnancy, third trimester: Secondary | ICD-10-CM | POA: Diagnosis not present

## 2021-11-26 DIAGNOSIS — Z8619 Personal history of other infectious and parasitic diseases: Secondary | ICD-10-CM | POA: Diagnosis not present

## 2021-12-10 DIAGNOSIS — O99213 Obesity complicating pregnancy, third trimester: Secondary | ICD-10-CM | POA: Diagnosis not present

## 2021-12-26 DIAGNOSIS — Z2839 Other underimmunization status: Secondary | ICD-10-CM | POA: Insufficient documentation

## 2021-12-26 DIAGNOSIS — O09899 Supervision of other high risk pregnancies, unspecified trimester: Secondary | ICD-10-CM | POA: Insufficient documentation

## 2021-12-26 DIAGNOSIS — O3663X0 Maternal care for excessive fetal growth, third trimester, not applicable or unspecified: Secondary | ICD-10-CM | POA: Insufficient documentation

## 2022-01-04 DIAGNOSIS — O99213 Obesity complicating pregnancy, third trimester: Secondary | ICD-10-CM | POA: Diagnosis not present

## 2022-01-04 DIAGNOSIS — O09899 Supervision of other high risk pregnancies, unspecified trimester: Secondary | ICD-10-CM | POA: Diagnosis not present

## 2022-01-04 DIAGNOSIS — O3663X0 Maternal care for excessive fetal growth, third trimester, not applicable or unspecified: Secondary | ICD-10-CM | POA: Diagnosis not present

## 2022-01-09 DIAGNOSIS — O09893 Supervision of other high risk pregnancies, third trimester: Secondary | ICD-10-CM | POA: Diagnosis not present

## 2022-01-09 DIAGNOSIS — O0993 Supervision of high risk pregnancy, unspecified, third trimester: Secondary | ICD-10-CM | POA: Diagnosis not present

## 2022-01-09 DIAGNOSIS — O99213 Obesity complicating pregnancy, third trimester: Secondary | ICD-10-CM | POA: Diagnosis not present

## 2022-01-16 DIAGNOSIS — Z2839 Other underimmunization status: Secondary | ICD-10-CM | POA: Diagnosis not present

## 2022-01-16 DIAGNOSIS — O0993 Supervision of high risk pregnancy, unspecified, third trimester: Secondary | ICD-10-CM | POA: Diagnosis not present

## 2022-01-16 DIAGNOSIS — O09893 Supervision of other high risk pregnancies, third trimester: Secondary | ICD-10-CM | POA: Diagnosis not present

## 2022-01-16 DIAGNOSIS — O3663X0 Maternal care for excessive fetal growth, third trimester, not applicable or unspecified: Secondary | ICD-10-CM | POA: Diagnosis not present

## 2022-01-16 DIAGNOSIS — O99213 Obesity complicating pregnancy, third trimester: Secondary | ICD-10-CM | POA: Diagnosis not present

## 2022-01-30 ENCOUNTER — Inpatient Hospital Stay: Payer: Medicaid Other | Admitting: Anesthesiology

## 2022-01-30 ENCOUNTER — Inpatient Hospital Stay
Admission: EM | Admit: 2022-01-30 | Discharge: 2022-02-02 | DRG: 787 | Disposition: A | Discharge status: Home / Self Care | Payer: Medicaid Other | Attending: Obstetrics | Admitting: Obstetrics

## 2022-01-30 ENCOUNTER — Other Ambulatory Visit: Payer: Self-pay

## 2022-01-30 ENCOUNTER — Encounter: Payer: Self-pay | Admitting: Obstetrics and Gynecology

## 2022-01-30 ENCOUNTER — Other Ambulatory Visit
Admission: RE | Admit: 2022-01-30 | Discharge: 2022-01-30 | Disposition: A | Discharge status: Home / Self Care | Payer: Medicaid Other | Source: Home / Self Care

## 2022-01-30 DIAGNOSIS — O99213 Obesity complicating pregnancy, third trimester: Secondary | ICD-10-CM | POA: Diagnosis present

## 2022-01-30 DIAGNOSIS — O99824 Streptococcus B carrier state complicating childbirth: Secondary | ICD-10-CM | POA: Diagnosis not present

## 2022-01-30 DIAGNOSIS — Z7982 Long term (current) use of aspirin: Secondary | ICD-10-CM

## 2022-01-30 DIAGNOSIS — O9081 Anemia of the puerperium: Secondary | ICD-10-CM | POA: Diagnosis not present

## 2022-01-30 DIAGNOSIS — Z3A38 38 weeks gestation of pregnancy: Secondary | ICD-10-CM

## 2022-01-30 DIAGNOSIS — O133 Gestational [pregnancy-induced] hypertension without significant proteinuria, third trimester: Secondary | ICD-10-CM | POA: Insufficient documentation

## 2022-01-30 DIAGNOSIS — Z3A39 39 weeks gestation of pregnancy: Secondary | ICD-10-CM

## 2022-01-30 DIAGNOSIS — R87619 Unspecified abnormal cytological findings in specimens from cervix uteri: Secondary | ICD-10-CM | POA: Insufficient documentation

## 2022-01-30 DIAGNOSIS — O99343 Other mental disorders complicating pregnancy, third trimester: Secondary | ICD-10-CM | POA: Insufficient documentation

## 2022-01-30 DIAGNOSIS — O3663X0 Maternal care for excessive fetal growth, third trimester, not applicable or unspecified: Secondary | ICD-10-CM | POA: Diagnosis present

## 2022-01-30 DIAGNOSIS — Z98891 History of uterine scar from previous surgery: Secondary | ICD-10-CM

## 2022-01-30 DIAGNOSIS — Z3483 Encounter for supervision of other normal pregnancy, third trimester: Secondary | ICD-10-CM

## 2022-01-30 DIAGNOSIS — Z2839 Other underimmunization status: Secondary | ICD-10-CM

## 2022-01-30 DIAGNOSIS — O163 Unspecified maternal hypertension, third trimester: Secondary | ICD-10-CM

## 2022-01-30 DIAGNOSIS — Z3490 Encounter for supervision of normal pregnancy, unspecified, unspecified trimester: Secondary | ICD-10-CM

## 2022-01-30 DIAGNOSIS — D62 Acute posthemorrhagic anemia: Secondary | ICD-10-CM | POA: Diagnosis not present

## 2022-01-30 DIAGNOSIS — A749 Chlamydial infection, unspecified: Secondary | ICD-10-CM | POA: Insufficient documentation

## 2022-01-30 DIAGNOSIS — O99214 Obesity complicating childbirth: Secondary | ICD-10-CM | POA: Diagnosis present

## 2022-01-30 DIAGNOSIS — Z20822 Contact with and (suspected) exposure to covid-19: Secondary | ICD-10-CM | POA: Diagnosis present

## 2022-01-30 DIAGNOSIS — Z880 Allergy status to penicillin: Secondary | ICD-10-CM

## 2022-01-30 DIAGNOSIS — B069 Rubella without complication: Secondary | ICD-10-CM | POA: Insufficient documentation

## 2022-01-30 DIAGNOSIS — O134 Gestational [pregnancy-induced] hypertension without significant proteinuria, complicating childbirth: Principal | ICD-10-CM | POA: Diagnosis present

## 2022-01-30 DIAGNOSIS — Z23 Encounter for immunization: Secondary | ICD-10-CM | POA: Diagnosis not present

## 2022-01-30 DIAGNOSIS — O139 Gestational [pregnancy-induced] hypertension without significant proteinuria, unspecified trimester: Secondary | ICD-10-CM | POA: Diagnosis present

## 2022-01-30 DIAGNOSIS — O98813 Other maternal infectious and parasitic diseases complicating pregnancy, third trimester: Secondary | ICD-10-CM | POA: Insufficient documentation

## 2022-01-30 DIAGNOSIS — F419 Anxiety disorder, unspecified: Secondary | ICD-10-CM | POA: Insufficient documentation

## 2022-01-30 DIAGNOSIS — F32A Depression, unspecified: Secondary | ICD-10-CM | POA: Insufficient documentation

## 2022-01-30 DIAGNOSIS — O09893 Supervision of other high risk pregnancies, third trimester: Secondary | ICD-10-CM | POA: Insufficient documentation

## 2022-01-30 DIAGNOSIS — O34219 Maternal care for unspecified type scar from previous cesarean delivery: Secondary | ICD-10-CM | POA: Diagnosis not present

## 2022-01-30 DIAGNOSIS — E669 Obesity, unspecified: Secondary | ICD-10-CM | POA: Insufficient documentation

## 2022-01-30 LAB — COMPREHENSIVE METABOLIC PANEL
ALT: 11 U/L (ref 0–44)
ALT: 8 U/L (ref 0–44)
AST: 14 U/L — ABNORMAL LOW (ref 15–41)
AST: 15 U/L (ref 15–41)
Albumin: 2.6 g/dL — ABNORMAL LOW (ref 3.5–5.0)
Albumin: 2.1 g/dL — ABNORMAL LOW (ref 3.5–5.0)
Alkaline Phosphatase: 151 U/L — ABNORMAL HIGH (ref 38–126)
Alkaline Phosphatase: 133 U/L — ABNORMAL HIGH (ref 38–126)
Anion gap: 6 (ref 5–15)
Anion gap: 10 (ref 5–15)
BUN: 9 mg/dL (ref 6–20)
BUN: 7 mg/dL (ref 6–20)
CO2: 24 mmol/L (ref 22–32)
CO2: 21 mmol/L — ABNORMAL LOW (ref 22–32)
Calcium: 9.6 mg/dL (ref 8.9–10.3)
Calcium: 9.3 mg/dL (ref 8.9–10.3)
Chloride: 105 mmol/L (ref 98–111)
Chloride: 104 mmol/L (ref 98–111)
Creatinine, Ser: 0.44 mg/dL (ref 0.44–1.00)
Creatinine, Ser: 0.5 mg/dL (ref 0.44–1.00)
GFR, Estimated: >60 mL/min (ref >60)
GFR, Estimated: >60 mL/min (ref >60)
Glucose, Bld: 90 mg/dL (ref 70–99)
Glucose, Bld: 75 mg/dL (ref 70–99)
Potassium: 4.3 mmol/L (ref 3.5–5.1)
Potassium: 4.2 mmol/L (ref 3.5–5.1)
Sodium: 135 mmol/L (ref 135–145)
Sodium: 135 mmol/L (ref 135–145)
Total Bilirubin: 0.3 mg/dL (ref 0.3–1.2)
Total Bilirubin: 0.2 mg/dL — ABNORMAL LOW (ref 0.3–1.2)
Total Protein: 6.7 g/dL (ref 6.5–8.1)
Total Protein: 5.5 g/dL — ABNORMAL LOW (ref 6.5–8.1)

## 2022-01-30 LAB — CBC
HCT: 40.6 % (ref 36.0–46.0)
HCT: 39.3 % (ref 36.0–46.0)
Hemoglobin: 12.8 g/dL (ref 12.0–15.0)
Hemoglobin: 12.3 g/dL (ref 12.0–15.0)
MCH: 26 pg (ref 26.0–34.0)
MCH: 25.7 pg — ABNORMAL LOW (ref 26.0–34.0)
MCHC: 31.5 g/dL (ref 30.0–36.0)
MCHC: 31.3 g/dL (ref 30.0–36.0)
MCV: 82.5 fL (ref 80.0–100.0)
MCV: 82 fL (ref 80.0–100.0)
Platelets: 306 10*3/uL (ref 150–400)
Platelets: 318 10*3/uL (ref 150–400)
RBC: 4.92 MIL/uL (ref 3.87–5.11)
RBC: 4.79 MIL/uL (ref 3.87–5.11)
RDW: 14.6 % (ref 11.5–15.5)
RDW: 14.6 % (ref 11.5–15.5)
WBC: 10.8 10*3/uL — ABNORMAL HIGH (ref 4.0–10.5)
WBC: 12.3 10*3/uL — ABNORMAL HIGH (ref 4.0–10.5)
nRBC: 0 % (ref 0.0–0.2)
nRBC: 0 % (ref 0.0–0.2)

## 2022-01-30 LAB — PROTEIN / CREATININE RATIO, URINE
Creatinine, Urine: 42 mg/dL
Protein Creatinine Ratio: 0.14 mg/mg{Cre} (ref 0.00–0.15)
Total Protein, Urine: 6 mg/dL

## 2022-01-30 LAB — RESP PANEL BY RT-PCR (FLU A&B, COVID) ARPGX2
Influenza A by PCR: NEGATIVE
Influenza B by PCR: NEGATIVE
SARS Coronavirus 2 by RT PCR: NEGATIVE

## 2022-01-30 LAB — TYPE AND SCREEN
ABO/RH(D): B POS
Antibody Screen: NEGATIVE

## 2022-01-30 MED ORDER — FENTANYL-BUPIVACAINE-NACL 0.5-0.125-0.9 MG/250ML-% EP SOLN
EPIDURAL | Status: AC
  Filled 2022-01-30: qty 250

## 2022-01-30 MED ORDER — EPHEDRINE 5 MG/ML INJ: 10.0000 mg | INTRAVENOUS | Status: DC | PRN

## 2022-01-30 MED ORDER — LACTATED RINGERS IV SOLN
500.0000 mL | INTRAVENOUS | Status: DC | PRN
  Administered 2022-01-31: 500 mL via INTRAVENOUS

## 2022-01-30 MED ORDER — LABETALOL HCL 5 MG/ML IV SOLN: 40.0000 mg | INTRAVENOUS | Status: DC | PRN

## 2022-01-30 MED ORDER — MISOPROSTOL 25 MCG QUARTER TABLET
25.0000 ug | ORAL_TABLET | ORAL | Status: DC | PRN
  Filled 2022-01-30: qty 1

## 2022-01-30 MED ORDER — CALCIUM CARBONATE ANTACID 500 MG PO CHEW
400.0000 mg | CHEWABLE_TABLET | Freq: Three times a day (TID) | ORAL | Status: DC | PRN
  Administered 2022-01-31: 400 mg via ORAL
  Filled 2022-01-30: qty 2

## 2022-01-30 MED ORDER — LACTATED RINGERS IV SOLN: INTRAVENOUS | Status: DC

## 2022-01-30 MED ORDER — PHENYLEPHRINE 40 MCG/ML (10ML) SYRINGE FOR IV PUSH (FOR BLOOD PRESSURE SUPPORT): 80.0000 ug | PREFILLED_SYRINGE | INTRAVENOUS | Status: DC | PRN

## 2022-01-30 MED ORDER — VANCOMYCIN HCL IN DEXTROSE 1-5 GM/200ML-% IV SOLN
1000.0000 mg | Freq: Two times a day (BID) | INTRAVENOUS | Status: DC
  Administered 2022-01-30: 1000 mg via INTRAVENOUS
  Filled 2022-01-30 (×2): qty 200

## 2022-01-30 MED ORDER — AMMONIA AROMATIC IN INHA
RESPIRATORY_TRACT | Status: AC
  Filled 2022-01-30: qty 10

## 2022-01-30 MED ORDER — LIDOCAINE HCL (PF) 1 % IJ SOLN: 30.0000 mL | INTRAMUSCULAR | Status: DC | PRN

## 2022-01-30 MED ORDER — HYDRALAZINE HCL 20 MG/ML IJ SOLN: 10.0000 mg | INTRAMUSCULAR | Status: DC | PRN

## 2022-01-30 MED ORDER — LABETALOL HCL 5 MG/ML IV SOLN: 20.0000 mg | INTRAVENOUS | Status: DC | PRN

## 2022-01-30 MED ORDER — FENTANYL CITRATE (PF) 100 MCG/2ML IJ SOLN: 50.0000 ug | INTRAMUSCULAR | Status: DC | PRN

## 2022-01-30 MED ORDER — ONDANSETRON HCL 4 MG/2ML IJ SOLN
4.0000 mg | Freq: Four times a day (QID) | INTRAMUSCULAR | Status: DC | PRN
  Administered 2022-01-31: 4 mg via INTRAVENOUS
  Filled 2022-01-30: qty 2

## 2022-01-30 MED ORDER — FENTANYL-BUPIVACAINE-NACL 0.5-0.125-0.9 MG/250ML-% EP SOLN
12.0000 mL/h | EPIDURAL | Status: DC | PRN
  Administered 2022-01-30: 12 mL/h via EPIDURAL

## 2022-01-30 MED ORDER — OXYTOCIN 10 UNIT/ML IJ SOLN
INTRAMUSCULAR | Status: AC
  Filled 2022-01-30: qty 2

## 2022-01-30 MED ORDER — OXYTOCIN-SODIUM CHLORIDE 30-0.9 UT/500ML-% IV SOLN
2.5000 [IU]/h | INTRAVENOUS | Status: DC
  Administered 2022-01-31: 2.5 [IU]/h via INTRAVENOUS
  Filled 2022-01-30: qty 500

## 2022-01-30 MED ORDER — BUPIVACAINE HCL (PF) 0.25 % IJ SOLN
INTRAMUSCULAR | Status: DC | PRN
  Administered 2022-01-30: 8 mL via EPIDURAL

## 2022-01-30 MED ORDER — OXYTOCIN-SODIUM CHLORIDE 30-0.9 UT/500ML-% IV SOLN
1.0000 m[IU]/min | INTRAVENOUS | Status: DC
  Administered 2022-01-30: 2 m[IU]/min via INTRAVENOUS
  Administered 2022-01-31: 30 [IU] via INTRAVENOUS
  Filled 2022-01-30 (×2): qty 500

## 2022-01-30 MED ORDER — SOD CITRATE-CITRIC ACID 500-334 MG/5ML PO SOLN: 30.0000 mL | ORAL | Status: DC | PRN

## 2022-01-30 MED ORDER — MISOPROSTOL 200 MCG PO TABS
ORAL_TABLET | ORAL | Status: AC
  Filled 2022-01-30: qty 4

## 2022-01-30 MED ORDER — LACTATED RINGERS IV SOLN
500.0000 mL | Freq: Once | INTRAVENOUS | Status: AC
  Administered 2022-01-30: 500 mL via INTRAVENOUS

## 2022-01-30 MED ORDER — OXYTOCIN BOLUS FROM INFUSION: 333.0000 mL | Freq: Once | INTRAVENOUS | Status: DC

## 2022-01-30 MED ORDER — LIDOCAINE HCL (PF) 1 % IJ SOLN
INTRAMUSCULAR | Status: AC
  Filled 2022-01-30: qty 30

## 2022-01-30 MED ORDER — ACETAMINOPHEN 500 MG PO TABS: 1000.0000 mg | ORAL_TABLET | Freq: Four times a day (QID) | ORAL | Status: DC | PRN

## 2022-01-30 MED ORDER — TERBUTALINE SULFATE 1 MG/ML IJ SOLN
0.2500 mg | Freq: Once | INTRAMUSCULAR | Status: DC | PRN
  Filled 2022-01-30: qty 1

## 2022-01-30 MED ORDER — LIDOCAINE-EPINEPHRINE (PF) 1.5 %-1:200000 IJ SOLN
INTRAMUSCULAR | Status: DC | PRN
  Administered 2022-01-30: 3 mL via EPIDURAL

## 2022-01-30 MED ORDER — DIPHENHYDRAMINE HCL 50 MG/ML IJ SOLN
12.5000 mg | INTRAMUSCULAR | Status: DC | PRN
  Administered 2022-01-31: 12.5 mg via INTRAVENOUS
  Filled 2022-01-30: qty 1

## 2022-01-30 MED ORDER — LABETALOL HCL 5 MG/ML IV SOLN: 80.0000 mg | INTRAVENOUS | Status: DC | PRN

## 2022-01-30 NOTE — Anesthesia Preprocedure Evaluation (Addendum)
Anesthesia Evaluation  Patient identified by MRN, date of birth, ID band Patient awake    Reviewed: Allergy & Precautions, NPO status , Patient's Chart, lab work & pertinent test results  History of Anesthesia Complications Negative for: history of anesthetic complications  Airway Mallampati: IV   Neck ROM: Full    Dental  (+) Dental Advidsory Given, Teeth Intact   Pulmonary neg shortness of breath, asthma , neg recent URI,    Pulmonary exam normal breath sounds clear to auscultation       Cardiovascular Exercise Tolerance: Good hypertension (gestational), (-) angina(-) Past MI Normal cardiovascular exam(-) dysrhythmias (-) Valvular Problems/Murmurs Rhythm:Regular Rate:Normal     Neuro/Psych PSYCHIATRIC DISORDERS (ADHD, PTSD) Depression negative neurological ROS     GI/Hepatic GERD  ,  Endo/Other  neg diabetesMorbid obesityClass 3 obesity  Renal/GU negative Renal ROS     Musculoskeletal   Abdominal   Peds  Hematology negative hematology ROS (+)   Anesthesia Other Findings Past Medical History: No date: Asthma No date: Attention deficit hyperactivity disorder (ADHD) No date: Depression No date: Hyperlipidemia No date: Vitamin D deficiency   Reproductive/Obstetrics (+) Pregnancy                           Anesthesia Physical Anesthesia Plan  ASA: 3  Anesthesia Plan: Spinal   Post-op Pain Management:    Induction:   PONV Risk Score and Plan: 2 and Treatment may vary due to age or medical condition  Airway Management Planned: Natural Airway  Additional Equipment:   Intra-op Plan:   Post-operative Plan:   Informed Consent: I have reviewed the patients History and Physical, chart, labs and discussed the procedure including the risks, benefits and alternatives for the proposed anesthesia with the patient or authorized representative who has indicated his/her understanding and  acceptance.     Dental Advisory Given  Plan Discussed with:   Anesthesia Plan Comments: (Patient reports no bleeding problems and no anticoagulant use.   Patient consented for risks of anesthesia including but not limited to:  - adverse reactions to medications - risk of bleeding, infection and or nerve damage from epidural that could lead to paralysis - risk of headache or failed epidural - nerve damage due to positioning - that if epidural is used for C-section that there is a chance of epidural failure requiring spinal placement or conversion to GA - Damage to heart, brain, lungs, other parts of body or loss of life  Patient voiced understanding.)       Anesthesia Quick Evaluation

## 2022-01-30 NOTE — Anesthesia Procedure Notes (Signed)
Epidural Patient location during procedure: OB Start time: 01/30/2022 10:53 PM End time: 01/30/2022 11:01 PM  Staffing Anesthesiologist: Darrin Nipper, MD Performed: anesthesiologist   Preanesthetic Checklist Completed: patient identified, IV checked, risks and benefits discussed, surgical consent, monitors and equipment checked, pre-op evaluation and timeout performed  Epidural Patient position: sitting Prep: Betadine Patient monitoring: heart rate, continuous pulse ox and blood pressure Approach: midline Location: L3-L4 Injection technique: LOR air  Needle:  Needle type: Tuohy  Needle gauge: 17 G Needle length: 9 cm Needle insertion depth: 9 cm Catheter at skin depth: 15 cm Test dose: negative  Assessment Sensory level: T4  Additional Notes Reason for block:procedure for pain

## 2022-01-30 NOTE — Progress Notes (Signed)
G2P1001 at [redacted]w[redacted]d, LMP of 05/03/2021, c/w early [place] at [redacted]w[redacted]d.  Scheduled for induction of labor for gestational HTN on 01/30/2022.   Prenatal provider: [facility] Pregnancy complicated by: Gestational HTN Uterine S>D Macrosomia - EFW [id number] on [place] 01/30/2022 Obesity in pregnancy  Chlamydia in pregnancy - TOC neg Abnormal pap smear - ASCUS, hrHPV pos - repeat postpartum  Depression and anxiety  Rubella non-immune GBS positive   Prenatal Labs: Blood type/Rh B pos  Antibody screen neg  Rubella Non-Immune  Varicella Immune  RPR NR  HBsAg Neg  HIV NR  GC neg  Chlamydia neg  Genetic screening cfDNA negative  1 hour GTT 105  3 hour GTT N/A  GBS pos   Tdap: declined  Flu: given 08/28/2021 Contraception: depo provera at 6 weeks PP Feeding preference: breast   ____ Drinda Butts, CNM Certified Nurse Midwife [facility]
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
## 2022-01-31 ENCOUNTER — Encounter: Admission: EM | Disposition: A | Discharge status: Home / Self Care | Payer: Self-pay | Source: Home / Self Care

## 2022-01-31 DIAGNOSIS — Z3A39 39 weeks gestation of pregnancy: Secondary | ICD-10-CM | POA: Diagnosis not present

## 2022-01-31 DIAGNOSIS — Z98891 History of uterine scar from previous surgery: Secondary | ICD-10-CM

## 2022-01-31 DIAGNOSIS — O134 Gestational [pregnancy-induced] hypertension without significant proteinuria, complicating childbirth: Secondary | ICD-10-CM | POA: Diagnosis not present

## 2022-01-31 DIAGNOSIS — O34219 Maternal care for unspecified type scar from previous cesarean delivery: Secondary | ICD-10-CM | POA: Diagnosis not present

## 2022-01-31 LAB — RPR: RPR Ser Ql: NONREACTIVE

## 2022-01-31 SURGERY — Surgical Case
Anesthesia: Spinal

## 2022-01-31 MED ORDER — ENOXAPARIN SODIUM 60 MG/0.6ML IJ SOSY
60.0000 mg | PREFILLED_SYRINGE | INTRAMUSCULAR | Status: DC
  Administered 2022-02-01: 60 mg via SUBCUTANEOUS
  Filled 2022-01-31 (×2): qty 0.6

## 2022-01-31 MED ORDER — BUPIVACAINE HCL (PF) 0.5 % IJ SOLN
INTRAMUSCULAR | Status: DC | PRN
  Administered 2022-01-31: 10 mL

## 2022-01-31 MED ORDER — BUPIVACAINE HCL (PF) 0.5 % IJ SOLN: 5.0000 mL | Freq: Once | INTRAMUSCULAR | Status: DC

## 2022-01-31 MED ORDER — NALOXONE HCL 4 MG/10ML IJ SOLN
1.0000 ug/kg/h | INTRAVENOUS | Status: DC | PRN
  Filled 2022-01-31: qty 5

## 2022-01-31 MED ORDER — DEXAMETHASONE SODIUM PHOSPHATE 10 MG/ML IJ SOLN
INTRAMUSCULAR | Status: DC | PRN
  Administered 2022-01-31: 10 mg via INTRAVENOUS

## 2022-01-31 MED ORDER — MIDAZOLAM HCL 2 MG/2ML IJ SOLN
INTRAMUSCULAR | Status: AC
  Filled 2022-01-31: qty 2

## 2022-01-31 MED ORDER — KETOROLAC TROMETHAMINE 30 MG/ML IJ SOLN: 30.0000 mg | Freq: Four times a day (QID) | INTRAMUSCULAR | Status: AC | PRN

## 2022-01-31 MED ORDER — PHENYLEPHRINE HCL-NACL 20-0.9 MG/250ML-% IV SOLN
INTRAVENOUS | Status: DC | PRN
  Administered 2022-01-31: 40 ug/min via INTRAVENOUS

## 2022-01-31 MED ORDER — SIMETHICONE 80 MG PO CHEW
80.0000 mg | CHEWABLE_TABLET | Freq: Three times a day (TID) | ORAL | Status: DC
  Administered 2022-01-31 – 2022-02-02 (×5): 80 mg via ORAL
  Filled 2022-01-31 (×5): qty 1

## 2022-01-31 MED ORDER — PRENATAL MULTIVITAMIN CH
1.0000 | ORAL_TABLET | Freq: Every day | ORAL | Status: DC
  Administered 2022-02-01: 1 via ORAL
  Filled 2022-01-31: qty 1

## 2022-01-31 MED ORDER — SOD CITRATE-CITRIC ACID 500-334 MG/5ML PO SOLN
ORAL | Status: AC
  Administered 2022-01-31: 30 mL via ORAL
  Filled 2022-01-31: qty 15

## 2022-01-31 MED ORDER — DIBUCAINE (PERIANAL) 1 % EX OINT: 1.0000 "application " | TOPICAL_OINTMENT | CUTANEOUS | Status: DC | PRN

## 2022-01-31 MED ORDER — 0.9 % SODIUM CHLORIDE (POUR BTL) OPTIME
TOPICAL | Status: DC | PRN
  Administered 2022-01-31: 1000 mL

## 2022-01-31 MED ORDER — SENNOSIDES-DOCUSATE SODIUM 8.6-50 MG PO TABS
2.0000 | ORAL_TABLET | ORAL | Status: DC
  Administered 2022-01-31 – 2022-02-01 (×2): 2 via ORAL
  Filled 2022-01-31 (×2): qty 2

## 2022-01-31 MED ORDER — NALOXONE HCL 0.4 MG/ML IJ SOLN: 0.4000 mg | INTRAMUSCULAR | Status: DC | PRN

## 2022-01-31 MED ORDER — OXYCODONE-ACETAMINOPHEN 5-325 MG PO TABS: 2.0000 | ORAL_TABLET | ORAL | Status: DC | PRN

## 2022-01-31 MED ORDER — NALBUPHINE HCL 10 MG/ML IJ SOLN
INTRAMUSCULAR | Status: AC
  Filled 2022-01-31: qty 1

## 2022-01-31 MED ORDER — SOD CITRATE-CITRIC ACID 500-334 MG/5ML PO SOLN: 30.0000 mL | ORAL | Status: AC

## 2022-01-31 MED ORDER — ONDANSETRON HCL 4 MG/2ML IJ SOLN: 4.0000 mg | Freq: Three times a day (TID) | INTRAMUSCULAR | Status: DC | PRN

## 2022-01-31 MED ORDER — DEXAMETHASONE SODIUM PHOSPHATE 10 MG/ML IJ SOLN
INTRAMUSCULAR | Status: AC
  Filled 2022-01-31: qty 1

## 2022-01-31 MED ORDER — OXYTOCIN-SODIUM CHLORIDE 30-0.9 UT/500ML-% IV SOLN
2.5000 [IU]/h | INTRAVENOUS | Status: AC
  Administered 2022-01-31: 2.5 [IU]/h via INTRAVENOUS

## 2022-01-31 MED ORDER — PROPOFOL 10 MG/ML IV BOLUS
INTRAVENOUS | Status: AC
  Filled 2022-01-31: qty 20

## 2022-01-31 MED ORDER — SODIUM CHLORIDE 0.9 % IV SOLN
500.0000 mg | INTRAVENOUS | Status: AC
  Administered 2022-01-31: 500 mg via INTRAVENOUS
  Filled 2022-01-31: qty 5

## 2022-01-31 MED ORDER — GENTAMICIN SULFATE 40 MG/ML IJ SOLN
5.0000 mg/kg | INTRAVENOUS | Status: AC
  Administered 2022-01-31: 390 mg via INTRAVENOUS
  Filled 2022-01-31: qty 9.75

## 2022-01-31 MED ORDER — BUPIVACAINE HCL (PF) 0.5 % IJ SOLN
INTRAMUSCULAR | Status: AC
  Filled 2022-01-31: qty 30

## 2022-01-31 MED ORDER — FENTANYL CITRATE (PF) 100 MCG/2ML IJ SOLN: 25.0000 ug | INTRAMUSCULAR | Status: DC | PRN

## 2022-01-31 MED ORDER — FENTANYL CITRATE (PF) 100 MCG/2ML IJ SOLN
INTRAMUSCULAR | Status: DC | PRN
  Administered 2022-01-31: 15 ug via INTRATHECAL

## 2022-01-31 MED ORDER — DIPHENHYDRAMINE HCL 50 MG/ML IJ SOLN: 12.5000 mg | INTRAMUSCULAR | Status: DC | PRN

## 2022-01-31 MED ORDER — OXYCODONE-ACETAMINOPHEN 5-325 MG PO TABS
1.0000 | ORAL_TABLET | ORAL | Status: DC | PRN
  Administered 2022-02-01 (×2): 1 via ORAL
  Filled 2022-01-31 (×2): qty 1

## 2022-01-31 MED ORDER — BUPIVACAINE IN DEXTROSE 0.75-8.25 % IT SOLN
INTRATHECAL | Status: DC | PRN
  Administered 2022-01-31: 1.4 mL via INTRATHECAL

## 2022-01-31 MED ORDER — MORPHINE SULFATE (PF) 0.5 MG/ML IJ SOLN
INTRAMUSCULAR | Status: AC
  Filled 2022-01-31: qty 10

## 2022-01-31 MED ORDER — FERROUS SULFATE 325 (65 FE) MG PO TABS
325.0000 mg | ORAL_TABLET | Freq: Two times a day (BID) | ORAL | Status: DC
  Administered 2022-01-31 – 2022-02-02 (×4): 325 mg via ORAL
  Filled 2022-01-31 (×4): qty 1

## 2022-01-31 MED ORDER — KETOROLAC TROMETHAMINE 30 MG/ML IJ SOLN
30.0000 mg | Freq: Four times a day (QID) | INTRAMUSCULAR | Status: AC | PRN
  Administered 2022-01-31: 30 mg via INTRAVENOUS
  Filled 2022-01-31: qty 1

## 2022-01-31 MED ORDER — NALBUPHINE HCL 10 MG/ML IJ SOLN
5.0000 mg | Freq: Once | INTRAMUSCULAR | Status: AC
  Administered 2022-01-31: 5 mg via INTRAVENOUS

## 2022-01-31 MED ORDER — COCONUT OIL OIL
1.0000 "application " | TOPICAL_OIL | Status: DC | PRN
  Filled 2022-01-31: qty 120

## 2022-01-31 MED ORDER — MEASLES, MUMPS & RUBELLA VAC IJ SOLR
0.5000 mL | Freq: Once | INTRAMUSCULAR | Status: AC
  Administered 2022-02-02: 0.5 mL via SUBCUTANEOUS
  Filled 2022-01-31 (×2): qty 0.5

## 2022-01-31 MED ORDER — MENTHOL 3 MG MT LOZG
1.0000 | LOZENGE | OROMUCOSAL | Status: DC | PRN
  Filled 2022-01-31: qty 9

## 2022-01-31 MED ORDER — IBUPROFEN 600 MG PO TABS
600.0000 mg | ORAL_TABLET | Freq: Four times a day (QID) | ORAL | Status: DC
  Administered 2022-02-01 – 2022-02-02 (×6): 600 mg via ORAL
  Filled 2022-01-31 (×6): qty 1

## 2022-01-31 MED ORDER — ONDANSETRON HCL 4 MG/2ML IJ SOLN
INTRAMUSCULAR | Status: AC
  Filled 2022-01-31: qty 2

## 2022-01-31 MED ORDER — ONDANSETRON HCL 4 MG/2ML IJ SOLN
INTRAMUSCULAR | Status: DC | PRN
Start: 2022-01-31 — End: 2022-01-31
  Administered 2022-01-31: 4 mg via INTRAVENOUS

## 2022-01-31 MED ORDER — BUPIVACAINE 0.25 % ON-Q PUMP DUAL CATH 400 ML
400.0000 mL | INJECTION | Status: DC
  Filled 2022-01-31: qty 400

## 2022-01-31 MED ORDER — EPHEDRINE 5 MG/ML INJ
INTRAVENOUS | Status: AC
  Filled 2022-01-31: qty 5

## 2022-01-31 MED ORDER — EPHEDRINE SULFATE (PRESSORS) 50 MG/ML IJ SOLN
INTRAMUSCULAR | Status: DC | PRN
  Administered 2022-01-31: 5 mg via INTRAVENOUS

## 2022-01-31 MED ORDER — MORPHINE SULFATE (PF) 0.5 MG/ML IJ SOLN
INTRAMUSCULAR | Status: DC | PRN
  Administered 2022-01-31: .1 mg via INTRATHECAL

## 2022-01-31 MED ORDER — CLINDAMYCIN PHOSPHATE 900 MG/50ML IV SOLN
900.0000 mg | INTRAVENOUS | Status: AC
  Administered 2022-01-31: 900 mg via INTRAVENOUS
  Filled 2022-01-31: qty 50

## 2022-01-31 MED ORDER — DIPHENHYDRAMINE HCL 25 MG PO CAPS: 25.0000 mg | ORAL_CAPSULE | Freq: Four times a day (QID) | ORAL | Status: DC | PRN

## 2022-01-31 MED ORDER — OXYCODONE HCL 5 MG PO TABS
5.0000 mg | ORAL_TABLET | ORAL | Status: DC | PRN
  Administered 2022-01-31: 5 mg via ORAL
  Filled 2022-01-31: qty 1

## 2022-01-31 MED ORDER — WITCH HAZEL-GLYCERIN EX PADS: 1.0000 "application " | MEDICATED_PAD | CUTANEOUS | Status: DC | PRN

## 2022-01-31 MED ORDER — LACTATED RINGERS IV SOLN: INTRAVENOUS | Status: DC

## 2022-01-31 MED ORDER — SODIUM CHLORIDE 0.9% FLUSH: 3.0000 mL | INTRAVENOUS | Status: DC | PRN

## 2022-01-31 MED ORDER — FENTANYL CITRATE (PF) 100 MCG/2ML IJ SOLN
INTRAMUSCULAR | Status: AC
  Filled 2022-01-31: qty 2

## 2022-01-31 MED ORDER — PROMETHAZINE HCL 25 MG/ML IJ SOLN: 6.2500 mg | INTRAMUSCULAR | Status: DC | PRN

## 2022-01-31 MED ORDER — DIPHENHYDRAMINE HCL 25 MG PO CAPS
25.0000 mg | ORAL_CAPSULE | ORAL | Status: DC | PRN
  Administered 2022-02-01: 25 mg via ORAL
  Filled 2022-01-31: qty 1

## 2022-01-31 SURGICAL SUPPLY — 39 items
ADH SKN CLS APL DERMABOND .7 (GAUZE/BANDAGES/DRESSINGS) ×1
BACTOSHIELD CHG 4% 4OZ (MISCELLANEOUS) ×1
CATH KIT ON-Q SILVERSOAK 5 (CATHETERS) ×2 IMPLANT
CATH KIT ON-Q SILVERSOAK 5IN (CATHETERS) ×4 IMPLANT
DERMABOND ADVANCED (GAUZE/BANDAGES/DRESSINGS) ×1
DERMABOND ADVANCED .7 DNX12 (GAUZE/BANDAGES/DRESSINGS) ×1 IMPLANT
DRSG OPSITE POSTOP 4X10 (GAUZE/BANDAGES/DRESSINGS) ×2 IMPLANT
DRSG TELFA 3X8 NADH (GAUZE/BANDAGES/DRESSINGS) ×2 IMPLANT
ELECT CAUTERY BLADE 6.4 (BLADE) ×2 IMPLANT
ELECT REM PT RETURN 9FT ADLT (ELECTROSURGICAL) ×2
ELECTRODE REM PT RTRN 9FT ADLT (ELECTROSURGICAL) ×1 IMPLANT
GAUZE SPONGE 4X4 12PLY STRL (GAUZE/BANDAGES/DRESSINGS) ×2 IMPLANT
GLOVE SURG ENC MOIS LTX SZ7 (GLOVE) ×2 IMPLANT
GLOVE SURG UNDER LTX SZ7.5 (GLOVE) ×2 IMPLANT
GOWN STRL REUS W/ TWL LRG LVL3 (GOWN DISPOSABLE) ×3 IMPLANT
GOWN STRL REUS W/TWL LRG LVL3 (GOWN DISPOSABLE) ×6
MANIFOLD NEPTUNE II (INSTRUMENTS) ×2 IMPLANT
MAT PREVALON FULL STRYKER (MISCELLANEOUS) ×2 IMPLANT
NS IRRIG 1000ML POUR BTL (IV SOLUTION) ×2 IMPLANT
PACK C SECTION AR (MISCELLANEOUS) ×2 IMPLANT
PAD DRESSING TELFA 3X8 NADH (GAUZE/BANDAGES/DRESSINGS) ×1 IMPLANT
PAD OB MATERNITY 4.3X12.25 (PERSONAL CARE ITEMS) ×4 IMPLANT
PAD PREP 24X41 OB/GYN DISP (PERSONAL CARE ITEMS) ×2 IMPLANT
PENCIL SMOKE EVACUATOR (MISCELLANEOUS) ×2 IMPLANT
RETRACTOR TRAXI PANNICULUS (MISCELLANEOUS) IMPLANT
SCRUB CHG 4% DYNA-HEX 4OZ (MISCELLANEOUS) ×1 IMPLANT
STRIP CLOSURE SKIN 1/2X4 (GAUZE/BANDAGES/DRESSINGS) ×2 IMPLANT
SUT MNCRL 4-0 (SUTURE) ×2
SUT MNCRL 4-0 27XMFL (SUTURE) ×1
SUT PDS AB 1 TP1 96 (SUTURE) ×2 IMPLANT
SUT PLAIN 2 0 (SUTURE) ×2
SUT PLAIN ABS 2-0 CT1 27XMFL (SUTURE) IMPLANT
SUT PLAIN GUT 0 (SUTURE) IMPLANT
SUT VIC AB 0 CTX 36 (SUTURE) ×4
SUT VIC AB 0 CTX36XBRD ANBCTRL (SUTURE) ×2 IMPLANT
SUTURE MNCRL 4-0 27XMF (SUTURE) ×1 IMPLANT
SWABSTK COMLB BENZOIN TINCTURE (MISCELLANEOUS) ×2 IMPLANT
TRAXI PANNICULUS RETRACTOR (MISCELLANEOUS) ×1
WATER STERILE IRR 500ML POUR (IV SOLUTION) ×2 IMPLANT
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
## 2022-01-31 NOTE — Lactation Note (Signed)
This note was copied from a baby's chart. Lactation Consultation Note  Patient Name: Selena Lowe [id number] Date: 01/31/2022 Reason for consult: Follow-up assessment;Term Age:25 hours [facility]-up from [facility]; first lengthy feeding   Maternal Data Has patient been taught Hand Expression?: Yes Does the patient have breastfeeding experience prior to this delivery?: Yes How long did the patient breastfeed?: hospital  Feeding Mother's Current Feeding Choice: Breast Milk LC did hand expression and spoon fed colostrum to help baby wake and begin to be eager for feeding.  LATCH Score Latch: Repeated attempts needed to sustain latch, nipple held in mouth throughout feeding, stimulation needed to elicit sucking reflex.  Audible Swallowing: A few with stimulation  Type of Nipple: Everted at rest and after stimulation (compressible breast tissue)  Comfort (Breast/Nipple): Soft / non-tender  Hold (Positioning): Assistance needed to correctly position infant at breast and maintain latch.  LATCH Score: 7  Baby still feeding when lactation support left room.  Lactation Tools Discussed/Used    Interventions Interventions: Breast feeding basics reviewed;Assisted with latch;Hand express;Adjust position;Support pillows;Position options;Education (spoon feed)  Anticipatory guidance given for overnight feeding expectations.  Discharge Pump: Personal  Consult Status Consult Status: Follow-up Date: 02/01/22 Follow-up type: In-patient    Lavonia Drafts 01/31/2022, 3:47 PM

## 2022-01-31 NOTE — Progress Notes (Signed)
Labor Progress Note  Selena Lowe is a 26 y.o. G2P1001 at [redacted]w[redacted]d by LMP admitted for induction of labor due to gestational hypertension and macrosomia.  Subjective: feeling pressure with contractions, also reports feeling tired and wanting to rest   Objective: BP (!) 143/81    Pulse (!) 103    Temp 97.9 F (36.6 C) (Oral)    Resp 18    Ht 5\' 2"  (1.575 m)    Wt 118.8 kg    LMP 05/03/2021 (Exact Date)    SpO2 97%    BMI 47.92 kg/m  Notable VS details: reviewed - several normal to mild range b/p's   Fetal Assessment: FHT:  FHR: 150 bpm, variability: moderate,  accelerations:  Present,  decelerations:  Present variables , FSE in place  Category/reactivity:  Category II UC:   regular, every 2-3 minutes, IUPC in place  SVE:   C/C/+1  Membrane status: AROM at 0153 Amniotic color: Clear   Labs: Lab Results  Component Value Date   WBC 12.3 (H) 01/30/2022   HGB 12.3 01/30/2022   HCT 39.3 01/30/2022   MCV 82.0 01/30/2022   PLT 318 01/30/2022    Assessment / Plan: IOL d/t gestational hypertension -progressing spontaneously, oxytocin off since 0150 this morning  -desires a break in pushing, will labor down for 30 minutes and then resume  -Discussed that she's made descent with pushing but progress has been slow and more molding is present  -She does not desire a cesarean birth at this time but will consider it if no progress after pushing resumes  Labor: Progressing normally Preeclampsia:  no signs or symptoms of toxicity and labs stable Fetal Wellbeing:  Category II - overall reassuring with moderate variability and accels, variables improve with interventions  Pain Control:  Epidural I/D:   GBS Pos, vanc x 1 dose, afebrile, ROM x 4 1/2 hours  Anticipated MOD:  NSVD->potential for primary cesarean birth   Minda Meo, [place] 01/31/2022, 6:20 AM

## 2022-01-31 NOTE — Progress Notes (Addendum)
0143-A Mackie CNM called to bedside for evaluation of variable decels. 46- CNM and RNs at bedside. Pitocin discontinued, position changes, IUPC and FSE placed by provider. Amnioinfusion started. CNM remained on unit throughout rest of shift, evaluating as needed or from remote surveillance monitors.

## 2022-01-31 NOTE — Lactation Note (Signed)
This note was copied from a baby's chart. Lactation Consultation Note  Patient Name: Selena Lowe [id number] Date: 01/31/2022 Reason for consult: L&D Initial assessment;Term;Other (Comment) (c-section) Age:25 hours  Lactation team at bedside in [facility] for assistance with feeding. First attempt earlier, mom states lots of colostrum, but unsure of how well the latch was.  Baby was skin to skin with mom, sleeping and calm. [facility] student removed baby from chest, attempted to wake baby for latching, but baby remained sleepy and not interested in feeding. Various feeding methods reviewed for next attempt: Hand expression/spoon feeding Curved tip/finger feeding  Education given for early cues, tiny tummies, volume needed for age, frequent attempts, and skin to skin.  Maternal Data Has patient been taught Hand Expression?: Yes Does the patient have breastfeeding experience prior to this delivery?: Yes How long did the patient breastfeed?: hospital  Feeding Mother's Current Feeding Choice: Breast Milk  LATCH Score Latch: Too sleepy or reluctant, no latch achieved, no sucking elicited.  Audible Swallowing: None  Type of Nipple: Everted at rest and after stimulation  Comfort (Breast/Nipple): Soft / non-tender  Hold (Positioning): Assistance needed to correctly position infant at breast and maintain latch.  LATCH Score: 5   Lactation Tools Discussed/Used    Interventions Interventions: Breast feeding basics reviewed;Assisted with latch;Skin to skin;Hand express;Adjust position;Support pillows;Position options;Education  Discharge    Consult Status Consult Status: Follow-up from L&D    Lavonia Drafts 01/31/2022, 12:14 PM

## 2022-01-31 NOTE — Progress Notes (Signed)
Selena Lowe is a 26 y.o. G2P1001 at [redacted]w[redacted]d  Pushing for 2.5 hour with CLE .   Subjective: Cle working well  Objective: BP 130/87    Pulse 97    Temp 98.3 F (36.8 C) (Oral)    Resp 18    Ht 5\' 2"  (1.575 m)    Wt 118.8 kg    LMP 05/03/2021 (Exact Date)    SpO2 92%    BMI 47.92 kg/m  No intake/output data recorded. No intake/output data recorded.  FHT:  150 + accels , + variable decels .  Good variability  UC:   regular, every 2-3 minutes SVE:   Dilation: 10 Effacement (%): 100 Station: 0 Exam by:: Selena Lowe, CNM Exam Selena Lowe c/c +2 , + caput  Efw 9+ lbs Labs: Lab Results  Component Value Date   WBC 12.3 (H) 01/30/2022   HGB 12.3 01/30/2022   HCT 39.3 01/30/2022   MCV 82.0 01/30/2022   PLT 318 01/30/2022    Assessment / Plan: Protracted second stage , but progression noted .  Reassuring fetal monitoring currently  Spoke to pt and FOB will allow to continue to push and will look for continued progression . At risk for AOD and need for cesarean .  Selena Lowe 01/31/2022, 7:08 AM

## 2022-01-31 NOTE — Op Note (Signed)
Cesarean Section Operative Note    Patient Name: Selena Lowe  MRN: [id number]  Date of Surgery: 01/31/2022   Pre-operative Diagnosis:  1) Second stage arrest 2) intrauterine pregnancy at [redacted]w[redacted]d   Post-operative Diagnosis:  1) Second stage arrest 2) intrauterine pregnancy at [redacted]w[redacted]d    Procedure: Primary Low Transverse Cesarean via Pfannenstiel incision with double layer uterine closure  Surgeon: Surgeon(s) and Role:    * Will Bonnet, MD - Primary   Assistants: Pamelia Hoit, CNM; No other capable assistant available, in surgery requiring high level assistant.  Anesthesia: spinal   Findings:  1) normal appearing gravid uterus, fallopian tubes, and ovaries 2) viable female infant with APGARs of 8 and 9, weight of 3,750 grams   Quantified Blood Loss: 774 mL  Total IV Fluids: 800 ml   Urine Output:  150 mL clear urine at end of case  Specimens: none  Complications: no complications  Disposition: PACU - hemodynamically stable.   Maternal Condition: stable   Baby condition / location:  Couplet care / Skin to Skin  Procedure Details:  The patient was seen in the Holding Room. The risks, benefits, complications, treatment options, and expected outcomes were discussed with the patient. The patient concurred with the proposed plan, giving informed consent. identified as Horton Marshall and the procedure verified as C-Section Delivery. A Time Out was held and the above information confirmed.   After induction of anesthesia, the patient was draped and prepped in the usual sterile manner. A Pfannenstiel incision was made and carried down through the subcutaneous tissue to the fascia. Fascial incision was made and extended transversely. The fascia was separated from the underlying rectus tissue superiorly and inferiorly. The peritoneum was identified and entered. Peritoneal incision was extended longitudinally. The bladder flap was not bluntly or sharply freed from the lower uterine  segment. A low transverse uterine incision was made and the hysterotomy was extended with cranial-caudal tension. Delivered from cephalic presentation was a 3,750 gram Living newborn infant(s) or Female with Apgar scores of 8 at one minute and 9 at five minutes. Cord ph was not sent the umbilical cord was clamped and cut cord blood was not obtained for evaluation. The placenta was removed Intact and appeared normal. The uterine outline, tubes and ovaries appeared normal. The uterine incision was closed with running locked sutures of 0 Vicryl.  A second layer of the same suture was thrown in an imbricating fashion.  Hemostasis was assured.  The uterus was returned to the abdomen and the paracolic gutters were cleared of all clots and debris.  The rectus muscles were inspected and found to be hemostatic.  The On-Q catheter pumps were inserted in accordance with the manufacturer's recommendations.  The catheters were inserted approximately 4cm cephelad to the incision line, approximately 1cm apart, straddling the midline.  They were inserted to a depth of the 4th mark. They were positioned superficial to the rectus abdominus muscles and deep to the rectus fascia.    The fascia was then reapproximated with running sutures of 1-0 PDS, looped. The subcutaneous tissue was reapproximated using 2-0 plain gut such that no greater than 2cm of dead space remained. The subcuticular closure was performed using 4-0 monocryl. The skin closure was reinforced using surgical skin glue.  The On-Q catheters were bolused with 5 mL of 0.5% marcaine plain for a total of 10 mL.  The catheters were affixed to the skin with surgical skin glue, steri-strips, and tegaderm.    The  surgical assistant performed tissue retraction, assistance with suturing, and fundal pressure.  Instrument, sponge, and needle counts were correct prior the abdominal closure and were correct at the conclusion of the case.  The patient received Gentamycin,  clindamycin and Azithromycin IV prior to skin incision (within 30 minutes). For VTE prophylaxis she was wearing SCDs throughout the case.  The assistant surgeon was a CNM due to lack of availability of another Counselling psychologist.    Signed: Will Bonnet, MD 01/31/2022 11:01 AM

## 2022-01-31 NOTE — Progress Notes (Signed)
Labor Check  Subj:  Complaints: feels pressure. Is tired of pushing and is worn out.  She has been pushing for > 3 hours without descent.   Obj:  BP 140/87 (BP Location: Right Arm)    Pulse (!) 109    Temp 98.3 F (36.8 C) (Oral)    Resp 18    Ht 5\' 2"  (1.575 m)    Wt 118.8 kg    LMP 05/03/2021 (Exact Date)    SpO2 99%    BMI 47.92 kg/m  Dose (milli-units/min) Oxytocin: 0 milli-units/min  Cervix: Dilation: 10 / Effacement (%): 100 / Station: Plus 1  Baseline FHR: 140 beats/min   Variability: moderate   Accelerations: present   Decelerations: present, late and variable decelerations present with contractinos.  Return to baseline with moderate variability.  Contractions: present frequency: 3-4 q 10 min Overall assessment: cat 2  Female chaperone present for pelvic exam:   A/P: 26 y.o. G2P1001 female at [redacted]w[redacted]d with second stage arrest and decelerations, though overall the baby appears to be reassuring.  1.  Labor: as above. Discussed that we suspect this is a large baby (4,300 grams). The station of the baby is too high to assist with an operative vaginal delivery. So, recommendation for cesarean delivery discussed with patient who readily agrees to abdominal delivery. I personally consented her for procedure.  Will make preparations to get her to the OR urgently  2.  FWB: as above. Overall reassuring at this time, Overall assessment: category 2  3.  GBS positive - severe PCN allergy.  Gent/clinda and azithromycin for c-section. Currently on vancomycin for GBS prophylaxis 4.  Pain: epidural. Functioning decently overall.   Prentice Docker, MD, [facility] 01/31/2022 8:57 AM

## 2022-01-31 NOTE — Progress Notes (Signed)
Labor Progress Note  Selena Lowe is a 26 y.o. G2P1001 at [redacted]w[redacted]d by LMP admitted for induction of labor due to Hypertension and macrosomia.  Subjective: Pt is showing signs of exhaustion and is saying she feels she can't push anymore.  Objective: BP 138/83    Pulse 84    Temp 97.9 F (36.6 C)    Resp 18    Ht 5\' 2"  (1.575 m)    Wt 118.8 kg    LMP 05/03/2021 (Exact Date)    SpO2 98%    BMI 47.92 kg/m   Fetal Assessment: FHT:  FHR: 140 bpm, variability: moderate,  accelerations:  Present,  decelerations:  Present variable with quick resolution Category/reactivity:  Category I  Labs: Lab Results  Component Value Date   WBC 12.3 (H) 01/30/2022   HGB 12.3 01/30/2022   HCT 39.3 01/30/2022   MCV 82.0 01/30/2022   PLT 318 01/30/2022    Assessment / Plan: Arrest of decent and maternal exhaustion - pt asking to stop pushing and would like to opt for c/s  Dr. Glennon Mac called for evaluation.  Labor:  No progress after 3 hours of pushing  Anticipated MOD:   c/s  Clydene Laming, CNM 01/31/2022, 11:16 AM

## 2022-01-31 NOTE — Transfer of Care (Signed)
Immediate Anesthesia Transfer of Care Note  Patient: Selena Lowe  Procedure(s) Performed: CESAREAN SECTION  Patient Location: Mother/Baby  Anesthesia Type:Spinal  Level of Consciousness: awake, alert  and oriented  Airway & Oxygen Therapy: Patient Spontanous Breathing  Post-op Assessment: Report given to RN and Post -op Vital signs reviewed and stable  Post vital signs: Reviewed  Last Vitals:  Vitals Value Taken Time  BP 138/83 01/31/22 1101  Temp 36.6 C 01/31/22 1101  Pulse 84 01/31/22 1101  Resp 18 01/31/22 1101  SpO2 98 % 01/31/22 1101    Last Pain:  Vitals:   01/31/22 0710  TempSrc:   PainSc: 0-No pain         Complications: No notable events documented.

## 2022-01-31 NOTE — Progress Notes (Signed)
Epic downtime occurred lasting from 0130 to 0615.  Written progress notes done and in chart for scanning.    Drinda Butts, CNM

## 2022-02-01 ENCOUNTER — Encounter: Payer: Self-pay | Admitting: Obstetrics and Gynecology

## 2022-02-01 DIAGNOSIS — O9081 Anemia of the puerperium: Secondary | ICD-10-CM | POA: Diagnosis not present

## 2022-02-01 LAB — CREATININE, SERUM
Creatinine, Ser: 0.63 mg/dL (ref 0.44–1.00)
GFR, Estimated: >60 mL/min (ref >60)

## 2022-02-01 LAB — CBC
HCT: 30.4 % — ABNORMAL LOW (ref 36.0–46.0)
Hemoglobin: 9.8 g/dL — ABNORMAL LOW (ref 12.0–15.0)
MCH: 26.3 pg (ref 26.0–34.0)
MCHC: 32.2 g/dL (ref 30.0–36.0)
MCV: 81.7 fL (ref 80.0–100.0)
Platelets: 309 10*3/uL (ref 150–400)
RBC: 3.72 MIL/uL — ABNORMAL LOW (ref 3.87–5.11)
RDW: 15 % (ref 11.5–15.5)
WBC: 21.1 10*3/uL — ABNORMAL HIGH (ref 4.0–10.5)
nRBC: 0 % (ref 0.0–0.2)

## 2022-02-01 NOTE — Progress Notes (Signed)
Postop Day  1  Subjective: up ad lib, voiding, tolerating PO, and experiencing more pain on her lower right side than left, but pain medicine is helping  Doing well, no concerns. Ambulating without difficulty, pain managed with PO meds, tolerating regular diet, and voiding without difficulty.   No fever/chills, chest pain, shortness of breath, nausea/vomiting, or leg pain. No nipple or breast pain. No headache, visual changes, or RUQ/epigastric pain.  Objective: BP 96/68 (BP Location: Left Arm)    Pulse 83    Temp 97.8 F (36.6 C) (Oral)    Resp 20    Ht 5\' 2"  (1.575 m)    Wt 118.8 kg    LMP 05/03/2021 (Exact Date)    SpO2 97%    BMI 47.92 kg/m    Physical Exam:  General: alert, cooperative, and appears stated age Breasts: soft/nontender CV: RRR Pulm: nl effort, CTABL Abdomen: soft, non-tender, active bowel sounds Uterine Fundus: firm Incision: healing well, no significant drainage, no significant erythema Perineum: minimal edema, intact Lochia: appropriate DVT Evaluation: No evidence of DVT seen on physical exam. Negative Homan's sign. No cords or calf tenderness. No significant calf/ankle edema.  Recent Labs    01/30/22 1934 02/01/22 0705  HGB 12.3 9.8*  HCT 39.3 30.4*  WBC 12.3* 21.1*  PLT 318 309    Assessment/Plan: 26 y.o. G2P1001 postpartum day # 1  -Continue routine postpartum care -Lactation consult PRN for breastfeeding-Discussed contraceptive options including implant, IUDs hormonal and non-hormonal, injection, pills/ring/patch, condoms, and NFP.  -Acute blood loss anemia - hemodynamically stable and asymptomatic; start PO ferrous sulfate BID with stool softeners  -Immunization status:   all immunizations up to date   Disposition: Continue inpatient postpartum care    ----- [place] Certified Nurse Midwife [facility]

## 2022-02-01 NOTE — Lactation Note (Signed)
This note was copied from a baby's chart. Lactation Consultation Note  Patient Name: Selena Lowe [id number] Date: 02/01/2022 Reason for consult: Follow-up assessment;1st time breastfeeding Age:26 hours Lactation to the room to review breastfeeding and pumping basics. Mother stated she thought she could just pump once a day and give milk and was not aware BF or pumping would be so much work. Encouraged Mother we are here to support her feeding goals. Mother states at this time she wants to solely formula feed and will call if she wants to initiate pumping or assistance with placing baby to the breast.   Maternal Data Has patient been taught Hand Expression?: Yes Does the patient have breastfeeding experience prior to this delivery?: Yes How long did the patient breastfeed?: 1 day (per Mother)  Feeding Mother's Current Feeding Choice: Formula Nipple Type: Slow - flow  Interventions Interventions: Breast feeding basics reviewed;Education  Discharge Discharge Education: Engorgement and breast care Pump: Personal (has a Tsrete pump)  Consult Status Consult Status: PRN    Shell Blanchette D Dangela How 02/01/2022, 11:24 AM
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
## 2022-02-01 NOTE — Anesthesia Post-op Follow-up Note (Signed)
°  Anesthesia Pain Follow-up Note  Patient: Selena Lowe  Day #: 1  Date of Follow-up: 02/01/2022 Time: 1:28 PM  Last Vitals:  Vitals:   02/01/22 0804 02/01/22 1229  BP: 96/68 110/85  Pulse: 83 (!) 103  Resp: 20 20  Temp: 36.6 C 36.6 C  SpO2: 97% 99%    Level of Consciousness: alert  Pain: none   Side Effects:None  Catheter Site Exam:clean, dry  Anti-Coag Meds (From admission, onward)   Start     Dose/Rate Route Frequency Ordered Stop   02/01/22 1200  enoxaparin (LOVENOX) injection 60 mg        60 mg Subcutaneous Every 24 hours 01/31/22 1502         Plan: D/C from anesthesia care at surgeon's request  Jennings Corado,  Clearnce Sorrel

## 2022-02-01 NOTE — Anesthesia Postprocedure Evaluation (Signed)
Anesthesia Post Note  Patient: Selena Lowe  Procedure(s) Performed: Sims  Patient location during evaluation: Mother Baby Anesthesia Type: Spinal Level of consciousness: oriented and awake and alert Pain management: pain level controlled Vital Signs Assessment: post-procedure vital signs reviewed and stable Respiratory status: spontaneous breathing and respiratory function stable Cardiovascular status: blood pressure returned to baseline and stable Postop Assessment: no headache, no backache, no apparent nausea or vomiting and able to ambulate Anesthetic complications: no   No notable events documented.   Last Vitals:  Vitals:   02/01/22 0804 02/01/22 1229  BP: 96/68 110/85  Pulse: 83 (!) 103  Resp: 20 20  Temp: 36.6 C 36.6 C  SpO2: 97% 99%    Last Pain:  Vitals:   02/01/22 1235  TempSrc:   PainSc: 4                  Jancarlo Biermann,  Clearnce Sorrel

## 2022-02-02 LAB — CBC WITH DIFFERENTIAL/PLATELET
Abs Immature Granulocytes: 0.17 10*3/uL — ABNORMAL HIGH (ref 0.00–0.07)
Basophils Absolute: 0.1 10*3/uL (ref 0.0–0.1)
Basophils Relative: 1 %
Eosinophils Absolute: 0.3 10*3/uL (ref 0.0–0.5)
Eosinophils Relative: 2 %
HCT: 30 % — ABNORMAL LOW (ref 36.0–46.0)
Hemoglobin: 9.3 g/dL — ABNORMAL LOW (ref 12.0–15.0)
Immature Granulocytes: 1 %
Lymphocytes Relative: 20 %
Lymphs Abs: 2.8 10*3/uL (ref 0.7–4.0)
MCH: 26 pg (ref 26.0–34.0)
MCHC: 31 g/dL (ref 30.0–36.0)
MCV: 83.8 fL (ref 80.0–100.0)
Monocytes Absolute: 0.8 10*3/uL (ref 0.1–1.0)
Monocytes Relative: 6 %
Neutro Abs: 9.9 10*3/uL — ABNORMAL HIGH (ref 1.7–7.7)
Neutrophils Relative %: 70 %
Platelets: 272 10*3/uL (ref 150–400)
RBC: 3.58 MIL/uL — ABNORMAL LOW (ref 3.87–5.11)
RDW: 14.8 % (ref 11.5–15.5)
WBC: 14 10*3/uL — ABNORMAL HIGH (ref 4.0–10.5)
nRBC: 0 % (ref 0.0–0.2)

## 2022-02-02 MED ORDER — COCONUT OIL OIL: 1.0000 "application " | TOPICAL_OIL | 0 refills | Status: AC | PRN

## 2022-02-02 MED ORDER — IBUPROFEN 600 MG PO TABS: 600.0000 mg | ORAL_TABLET | Freq: Four times a day (QID) | ORAL | 0 refills | Status: AC

## 2022-02-02 MED ORDER — SENNOSIDES-DOCUSATE SODIUM 8.6-50 MG PO TABS: 2.0000 | ORAL_TABLET | ORAL | 0 refills | Status: AC

## 2022-02-02 MED ORDER — ACETAMINOPHEN 500 MG PO TABS
1000.0000 mg | ORAL_TABLET | Freq: Three times a day (TID) | ORAL | 0 refills | Status: AC | PRN
Start: 2022-02-02 — End: 2022-02-05

## 2022-02-02 MED ORDER — SIMETHICONE 80 MG PO CHEW: 80.0000 mg | CHEWABLE_TABLET | Freq: Three times a day (TID) | ORAL | 0 refills | Status: AC

## 2022-02-02 MED ORDER — DIPHENHYDRAMINE HCL 25 MG PO CAPS: 25.0000 mg | ORAL_CAPSULE | Freq: Four times a day (QID) | ORAL | 0 refills | Status: AC | PRN

## 2022-02-02 MED ORDER — OXYCODONE HCL 5 MG PO TABS: 5.0000 mg | ORAL_TABLET | Freq: Four times a day (QID) | ORAL | 0 refills | Status: AC | PRN

## 2022-02-02 NOTE — Progress Notes (Signed)
Discharge instructions reviewed with patient who verbalized understanding.  Follow up instructions for 1 week incision check reviewed, prescriptions reviewed. No further questions at this time

## 2022-02-02 NOTE — Progress Notes (Signed)
Post Partum Day 2 Subjective: Doing well, no complaints.  Tolerating regular diet, pain with PO meds, voiding and ambulating without difficulty.  No CP SOB Fever,Chills, N/V or leg pain; denies nipple or breast pain, no HA change of vision, RUQ/epigastric pain  Objective: BP 112/71 (BP Location: Left Arm)    Pulse 81    Temp 97.6 F (36.4 C) (Oral)    Resp 18    Ht $R'5\' 2"'Ba$  (1.575 m)    Wt 118.8 kg    LMP 05/03/2021 (Exact Date)    SpO2 99%    BMI 47.92 kg/m    Physical Exam:  General: NAD Breasts: soft/nontender CV: RRR Pulm: nl effort, CTABL Abdomen: soft, NT, BS x 4 Incision: Honeycomb dsg intact, OnQ site continues to leak small amount serous discharge.  Lochia: small Uterine Fundus: fundus firm and 1 fb below umbilicus DVT Evaluation: no cords, ttp LEs   Recent Labs    01/30/22 1934 02/01/22 0705  HGB 12.3 9.8*  HCT 39.3 30.4*  WBC 12.3* 21.1*  PLT 318 309    Assessment/Plan: 26 y.o. G2P1001 postpartum day # 2  - Continue routine PP care - Lactation consult prn - Discussed contraceptive options, plans Depo at 6wks PP.  - Acute blood loss anemia - hemodynamically stable and asymptomatic; start po ferrous sulfate BID with stool softeners  - Immunization status: Needs MMR prior to DC    Disposition: Does desire Dc home today.     Francetta Found, CNM 02/02/2022  8:27 AM

## 2022-02-04 ENCOUNTER — Telehealth: Payer: Self-pay

## 2022-02-04 NOTE — Telephone Encounter (Signed)
Transition Care Management Unsuccessful Follow-up Telephone Call  Date of discharge and from where:  02/02/2022-[facility]  Attempts:  1st Attempt  Reason for unsuccessful TCM follow-up call:  Left voice message

## 2022-02-05 NOTE — Telephone Encounter (Signed)
Patient is seen at [facility].

## 2022-03-18 DIAGNOSIS — Z30430 Encounter for insertion of intrauterine contraceptive device: Secondary | ICD-10-CM | POA: Diagnosis not present

## 2022-03-18 DIAGNOSIS — Z113 Encounter for screening for infections with a predominantly sexual mode of transmission: Secondary | ICD-10-CM | POA: Diagnosis not present

## 2022-03-18 DIAGNOSIS — Z1332 Encounter for screening for maternal depression: Secondary | ICD-10-CM | POA: Diagnosis not present

## 2022-04-28 ENCOUNTER — Encounter: Payer: Self-pay | Admitting: Obstetrics and Gynecology

## 2022-07-05 DIAGNOSIS — S92351A Displaced fracture of fifth metatarsal bone, right foot, initial encounter for closed fracture: Secondary | ICD-10-CM | POA: Diagnosis not present

## 2022-09-16 DIAGNOSIS — W57XXXA Bitten or stung by nonvenomous insect and other nonvenomous arthropods, initial encounter: Secondary | ICD-10-CM | POA: Diagnosis not present

## 2022-09-16 DIAGNOSIS — Y999 Unspecified external cause status: Secondary | ICD-10-CM | POA: Diagnosis not present

## 2022-09-16 DIAGNOSIS — S30861A Insect bite (nonvenomous) of abdominal wall, initial encounter: Secondary | ICD-10-CM | POA: Diagnosis not present

## 2022-11-13 NOTE — Progress Notes (Incomplete)
New patient visit   Patient: Selena Lowe   DOB: [birth year]   26 y.o. Female  MRN: [id number] Visit Date: 11/18/2022  Today's healthcare provider: Mardene Speak, PA-C   No chief complaint on file.  Subjective    Selena Lowe is a 26 y.o. female who presents today as a new patient to establish care.  HPI  G2P1 Cesarean section on 01/31/22 Gestational hypertension Not breastfeeding Last OIT:GPQDI, HPV positive 07/30/21 Dr. Prentice Docker at [facility] clinic OBGYN  Past Medical History:  Diagnosis Date  . Asthma   . Attention deficit hyperactivity disorder (ADHD)   . Depression   . Hyperlipidemia   . Vitamin D deficiency    Past Surgical History:  Procedure Laterality Date  . CESAREAN SECTION  01/31/2022   Procedure: CESAREAN SECTION;  Surgeon: Will Bonnet, MD;  Location: [facility] ORS;  Service: Obstetrics;;   Family Status  Relation Name Status  . Mother  Deceased  . MGM  (Not Specified)  . Father  Alive   Family History  Problem Relation Age of Onset  . Cervical cancer Maternal Grandmother   . Lung cancer Maternal Grandmother   . Clotting disorder Maternal Grandmother   . Diabetes Maternal Grandmother    Social History   Socioeconomic History  . Marital status: Single    Spouse name: Not on file  . Number of children: Not on file  . Years of education: Not on file  . Highest education level: Not on file  Occupational History  . Not on file  Tobacco Use  . Smoking status: Never  . Smokeless tobacco: Never  Substance and Sexual Activity  . Alcohol use: No  . Drug use: No  . Sexual activity: Yes  Other Topics Concern  . Not on file  Social History Narrative  . Not on file   Social Determinants of Health   Financial Resource Strain: Not on file  Food Insecurity: Not on file  Transportation Needs: Not on file  Physical Activity: Not on file  Stress: Not on file  Social Connections: Not on file   Outpatient Medications Prior to Visit   Medication Sig  . coconut oil OIL Apply 1 application topically as needed.  . diphenhydrAMINE (BENADRYL) 25 mg capsule Take 1 capsule (25 mg total) by mouth every 6 (six) hours as needed for itching.  . docusate sodium (COLACE) 100 MG capsule Take 1 capsule (100 mg total) by mouth daily as needed for mild constipation. Do not take if you have loose stools  . ergocalciferol (VITAMIN D2) 50000 units capsule Take 50,000 Units by mouth once a week.  . ferrous sulfate 325 (65 FE) MG EC tablet Take 1 tablet (325 mg total) by mouth 2 (two) times daily.  Marland Kitchen ibuprofen (ADVIL) 600 MG tablet Take 1 tablet (600 mg total) by mouth every 6 (six) hours.  . Prenatal 28-0.8 MG TABS Take 1 tablet by mouth daily.  Marland Kitchen senna-docusate (SENOKOT-S) 8.6-50 MG tablet Take 2 tablets by mouth daily.  . simethicone (MYLICON) 80 MG chewable tablet Chew 1 tablet (80 mg total) by mouth 3 (three) times daily after meals.  . simvastatin (ZOCOR) 20 MG tablet Take 20 mg by mouth daily.   No facility-administered medications prior to visit.   Allergies  Allergen Reactions  . Penicillins Anaphylaxis and Hives    Immunization History  Administered Date(s) Administered  . MMR 02/02/2022  . Varicella 06/04/2017    Health Maintenance  Topic Date Due  .  COVID-19 Vaccine (1) Never done  . HPV VACCINES (1 - 2-dose series) Never done  . Hepatitis C Screening  Never done  . PAP-Cervical Cytology Screening  Never done  . PAP SMEAR-Modifier  Never done  . INFLUENZA VACCINE  07/23/2022  . HIV Screening  Completed    Patient Care Team: Remi Haggard, FNP as PCP - General (Family Medicine)  Review of Systems  {Labs  Heme  Chem  Endocrine  Serology  Results Review (optional):23779}   Objective    There were no vitals taken for this visit. {Show previous vital signs (optional):23777}  Physical Exam ***  Depression Screen     No data to display         No results found for any visits on 11/18/22.   Assessment & Plan     ***  No follow-ups on file.     The patient was advised to call back or seek an in-person evaluation if the symptoms worsen or if the condition fails to improve as anticipated.  I discussed the assessment and treatment plan with the patient. The patient was provided an opportunity to ask questions and all were answered. The patient agreed with the plan and demonstrated an understanding of the instructions.  The entirety of the information documented in the History of Present Illness, Review of Systems and Physical Exam were personally obtained by me. Portions of this information were initially documented by the CMA and reviewed by me for thoroughness and accuracy.   Mardene Speak, [facility], [place] [phone] (phone) [phone] (fax)

## 2022-11-18 ENCOUNTER — Ambulatory Visit: Payer: Medicaid Other | Admitting: Physician Assistant

## 2023-06-08 DIAGNOSIS — N939 Abnormal uterine and vaginal bleeding, unspecified: Secondary | ICD-10-CM | POA: Diagnosis not present

## 2023-06-08 DIAGNOSIS — Z880 Allergy status to penicillin: Secondary | ICD-10-CM | POA: Diagnosis not present

## 2023-07-23 ENCOUNTER — Emergency Department
Admission: EM | Admit: 2023-07-23 | Discharge: 2023-07-23 | Disposition: A | Discharge status: Home / Self Care | Payer: Medicaid Other | Attending: Emergency Medicine | Admitting: Emergency Medicine

## 2023-07-23 ENCOUNTER — Other Ambulatory Visit: Payer: Self-pay

## 2023-07-23 DIAGNOSIS — H9201 Otalgia, right ear: Secondary | ICD-10-CM | POA: Diagnosis present

## 2023-07-23 DIAGNOSIS — H6691 Otitis media, unspecified, right ear: Secondary | ICD-10-CM | POA: Diagnosis not present

## 2023-07-23 DIAGNOSIS — H6690 Otitis media, unspecified, unspecified ear: Secondary | ICD-10-CM | POA: Diagnosis not present

## 2023-07-23 MED ORDER — DOXYCYCLINE HYCLATE 100 MG PO CAPS: 100.0000 mg | ORAL_CAPSULE | Freq: Two times a day (BID) | ORAL | 0 refills | Status: AC

## 2023-07-23 NOTE — ED Provider Notes (Signed)
[facility] Provider Note    Event Date/Time   First MD Initiated Contact with Patient 07/23/23 2002     (approximate)   History   Otalgia   HPI  Selena Lowe is a 27 y.o. female PMH of ADHD, depression and HLD who presents for evaluation of right ear pain that started today.  Patient states all of a sudden she felt something pop and then had bloody drainage from her right ear.  She used some hydrogen peroxide to clean it out and then came to the ED.  She has had some mild congestion this week but is otherwise asymptomatic.     Physical Exam   Triage Vital Signs: ED Triage Vitals  Encounter Vitals Group     BP 07/23/23 1959 (!) 138/90     Systolic BP Percentile --      Diastolic BP Percentile --      Pulse Rate 07/23/23 1959 89     Resp 07/23/23 1959 18     Temp 07/23/23 1959 97.9 F (36.6 C)     Temp Source 07/23/23 1959 Oral     SpO2 07/23/23 1959 98 %     Weight 07/23/23 1957 230 lb (104.3 kg)     Height 07/23/23 1957 5\' 2"  (1.575 m)     Head Circumference --      Peak Flow --      Pain Score 07/23/23 1957 6     Pain Loc --      Pain Education --      Exclude from Growth Chart --     Most recent vital signs: Vitals:   07/23/23 1959  BP: (!) 138/90  Pulse: 89  Resp: 18  Temp: 97.9 F (36.6 C)  SpO2: 98%     General: Awake, no distress.  CV:  Good peripheral perfusion.  Resp:  Normal effort.  Abd:  No distention.  Other:  Right TM is bulging and erythematous.  Right EAC clear.  No tenderness over the mastoid process or surrounding redness.   ED Results / Procedures / Treatments   Labs (all labs ordered are listed, but only abnormal results are displayed) Labs Reviewed - No data to display    PROCEDURES:  Critical Care performed: No  Procedures   MEDICATIONS ORDERED IN ED: Medications - No data to display   IMPRESSION / MDM / ASSESSMENT AND PLAN / ED COURSE  I reviewed the triage vital signs and the nursing  notes.                              Differential diagnosis includes, but is not limited to, tympanic membrane perforation, otitis media, otitis externa.  Patient's presentation is most consistent with acute, uncomplicated illness.  Otoscopic exam showed patient has otitis media.  Based on patient's history of her ear popping it is possible she also has a tympanic membrane perforation that was not visualized.  I advised her to take the antibiotic as prescribed.  She will be sent doxycycline as she has has an anaphylactic reaction to penicillins.  She can return to the ED, urgent care or PCP if she does not have an improvement in her symptoms.  Patient voiced understanding, all questions were answered and she was stable at discharge.      FINAL CLINICAL IMPRESSION(S) / ED DIAGNOSES   Final diagnoses:  Acute otitis media, unspecified otitis media type  Rx / DC Orders   ED Discharge Orders          Ordered    doxycycline (VIBRAMYCIN) 100 MG capsule  2 times daily        07/23/23 2020             Note:  This document was prepared using Dragon voice recognition software and may include unintentional dictation errors.   Cameron Ali, PA-C 07/23/23 2023    Sharyn Creamer, MD 07/23/23 2127

## 2023-07-23 NOTE — ED Triage Notes (Signed)
Pt to ED via POV c/o right earache that started today. Pt reports she had some blood coming out of it but no other drainage. Pt says she cleaned out ear with peroxide PTA. Denies fever

## 2023-07-23 NOTE — Discharge Instructions (Signed)
You have an ear infection in your right ear.  Please take the doxycycline twice a day for 7 days.  You should see an improvement in your symptoms in 2 to 3 days.  If your pain is getting worse please follow-up with your primary care, go to urgent care or return to the ED.
